# Patient Record
Sex: Male | Born: 1995
Health system: Southern US, Community
[De-identification: ages and names within clinical notes are randomized; demographics above are authoritative.]

## PROBLEM LIST (undated history)

## (undated) HISTORY — PX: OTHER SURGICAL HISTORY: SHX169

---
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
## 2015-04-29 ENCOUNTER — Emergency Department: Payer: Self-pay

## 2015-04-29 ENCOUNTER — Emergency Department
Admission: EM | Admit: 2015-04-29 | Discharge: 2015-04-29 | Disposition: A | Discharge status: Home / Self Care | Payer: Self-pay | Attending: Emergency Medicine | Admitting: Emergency Medicine

## 2015-04-29 ENCOUNTER — Encounter: Payer: Self-pay | Admitting: *Deleted

## 2015-04-29 DIAGNOSIS — Y9367 Activity, basketball: Secondary | ICD-10-CM | POA: Insufficient documentation

## 2015-04-29 DIAGNOSIS — Y998 Other external cause status: Secondary | ICD-10-CM | POA: Insufficient documentation

## 2015-04-29 DIAGNOSIS — H109 Unspecified conjunctivitis: Secondary | ICD-10-CM | POA: Insufficient documentation

## 2015-04-29 DIAGNOSIS — S40211A Abrasion of right shoulder, initial encounter: Secondary | ICD-10-CM | POA: Insufficient documentation

## 2015-04-29 DIAGNOSIS — H10022 Other mucopurulent conjunctivitis, left eye: Secondary | ICD-10-CM

## 2015-04-29 DIAGNOSIS — W1809XA Striking against other object with subsequent fall, initial encounter: Secondary | ICD-10-CM | POA: Insufficient documentation

## 2015-04-29 DIAGNOSIS — S060X1A Concussion with loss of consciousness of 30 minutes or less, initial encounter: Secondary | ICD-10-CM | POA: Insufficient documentation

## 2015-04-29 DIAGNOSIS — Y9289 Other specified places as the place of occurrence of the external cause: Secondary | ICD-10-CM | POA: Insufficient documentation

## 2015-04-29 MED ORDER — BACITRACIN ZINC 500 UNIT/GM EX OINT
TOPICAL_OINTMENT | CUTANEOUS | Status: AC
  Administered 2015-04-29: 1
  Filled 2015-04-29: qty 0.9

## 2015-04-29 MED ORDER — ERYTHROMYCIN 5 MG/GM OP OINT: TOPICAL_OINTMENT | Freq: Four times a day (QID) | OPHTHALMIC | Status: AC

## 2015-04-29 MED ORDER — ERYTHROMYCIN 5 MG/GM OP OINT
TOPICAL_OINTMENT | OPHTHALMIC | Status: AC
  Administered 2015-04-29: 1 via OPHTHALMIC
  Filled 2015-04-29: qty 1

## 2015-04-29 MED ORDER — ERYTHROMYCIN 5 MG/GM OP OINT
TOPICAL_OINTMENT | Freq: Once | OPHTHALMIC | Status: AC
  Administered 2015-04-29: 1 via OPHTHALMIC
  Filled 2015-04-29: qty 3.5

## 2015-04-29 NOTE — ED Provider Notes (Signed)
[facility] Emergency Department Provider Note   ____________________________________________  Time seen: 9:45 PM I have reviewed the triage vital signs and the triage nursing note.  HISTORY  Chief Complaint Head Injury   Historian Patient  HPI Dennis Edwards is a 19 y.o. male who is complaining of headache and nausea after playing basketball and hitting his face against some metal piece and then falling to the ground onto his right shoulder. Reports a short loss of consciousness. Since the event has had a moderate global headache, dizziness, nausea. There's been no vomiting. There's been no seizure. He is able to move his shoulder but does have an abrasion on the right posterior aspect. There is no neck pain. There is no chest pain nor trouble breathing or abdominal pain. His mother thinks that he is thinking slowly and a little bit sleepy     History reviewed. No pertinent past medical history.  There are no active problems to display for this patient.   History reviewed. No pertinent past surgical history.  Current Outpatient Rx  Name  Route  Sig  Dispense  Refill  . diphenhydrAMINE (BENADRYL) 25 MG tablet   Oral   Take 25 mg by mouth every 6 (six) hours as needed. For allergies          . erythromycin ophthalmic ointment   Both Eyes   Place into both eyes 4 (four) times daily.   3.5 g   0   . ibuprofen (ADVIL,MOTRIN) 200 MG tablet   Oral   Take 400 mg by mouth every 6 (six) hours as needed. For pain            Allergies Review of patient's allergies indicates no known allergies.  No family history on file.  Social History History  Substance Use Topics  . Smoking status: Never Smoker   . Smokeless tobacco: Not on file  . Alcohol Use: No    Review of Systems  Constitutional: Negative for recent illness Eyes: Negative for visual changes. Left eye pain and itching and redness for 1 day, prior to the head trauma ENT: No  facial lacerations Cardiovascular: Negative for chest pain. Respiratory: Negative for shortness of breath. Gastrointestinal: Negative for abdominal pain Genitourinary:  Musculoskeletal: Negative for back pain. Skin: Negative for rash. Neurological: Negative for focal weakness or numbness  ____________________________________________   PHYSICAL EXAM:  VITAL SIGNS: ED Triage Vitals  Enc Vitals Group     BP 04/29/15 2110 109/65 mmHg     Pulse Rate 04/29/15 2110 96     Resp 04/29/15 2110 18     Temp 04/29/15 2110 98.9 F (37.2 C)     Temp Source 04/29/15 2110 Oral     SpO2 04/29/15 2110 97 %     Weight 04/29/15 2110 145 lb (65.772 kg)     Height 04/29/15 2110 6' (1.829 m)     Head Cir --      Peak Flow --      Pain Score 04/29/15 2111 5     Pain Loc --      Pain Edu? --      Excl. in GC? --      Constitutional: Alert and oriented. Well appearing and in no distress. Eyes: Left eye conjunctiva is injected. There is no hyphema bilaterally. PERRL. Normal extraocular movements. ENT   Head: Normocephalic and atraumatic.   Nose: No congestion/rhinnorhea.   Mouth/Throat: Mucous membranes are moist.   Neck: No stridor. Cardiovascular: Normal rate,  regular rhythm.  No murmurs, rubs, or gallops. Respiratory: Normal respiratory effort without tachypnea nor retractions. Breath sounds are clear and equal bilaterally. No wheezes/rales/rhonchi. Gastrointestinal: Soft and nontender. No distention.  Genitourinary: Deferred Musculoskeletal: Nontender with normal range of motion in all extremities. No joint effusions.  No lower extremity tenderness nor edema. Skin abrasion over posterior right shoulder with full range of motion of the right shoulder. Neurologic:  Normal speech and language. Slightly slow to answer questions. No gross focal neurologic deficits are appreciated. Skin:  Skin is warm, dry and intact. Skin abrasion on posterior right shoulder Psychiatric: Mood and  affect are normal. Speech and behavior are normal. Patient exhibits appropriate insight and judgment.  ____________________________________________   EKG   ____________________________________________  LABS (pertinent positives/negatives)  None  ____________________________________________  RADIOLOGY Radiologist results reviewed  CT head: Negative for acute intracranial injury __________________________________________  PROCEDURES  Procedure(s) performed: None Critical Care performed: None  ____________________________________________   ED COURSE / ASSESSMENT AND PLAN  Pertinent labs & imaging results that were available during my care of the patient were reviewed by me and considered in my medical decision making (see chart for details).  I discussed with mom the patient does have symptoms consistent with a concussion, however given that they still feel like he is not at his baseline, and with persistent headache and nausea, we discussed head CT for intracranial evaluation. Risks versus benefits were discussed and we chose to proceed.   Negative for intracranial chronic injury. Patient were given erythromycin eye ointment for left sided pinkeye. Concussion precautions discussed with mom and patient. [facility].   ___________________________________________   FINAL CLINICAL IMPRESSION(S) / ED DIAGNOSES   Final diagnoses:  Abrasion of right shoulder area, initial encounter  Head injury, closed, with concussion, with loss of consciousness of 30 minutes or less, initial encounter  Pink eye disease of left eye      Governor Rooksebecca Shai Rasmussen, MD 04/29/15 2222

## 2015-04-29 NOTE — ED Notes (Signed)
Pt reports he was playing basketball and went for a pass, he hit his face/head on a metal piece and fell to the ground. States LOC, with nausea and headache since the event. Abrasion to right shoulder.

## 2015-04-29 NOTE — ED Notes (Signed)
Patient with no complaints at this time. Respirations even and unlabored. Skin warm/dry. Discharge instructions reviewed with patient at this time. Patient given opportunity to voice concerns/ask questions. Patient discharged at this time and left Emergency Department with steady gait.   

## 2015-04-29 NOTE — Discharge Instructions (Signed)
Your symptoms are consistent with a concussion. No sports until released by a physician. You are referred to primary care at [facility]. Return to the emergency department for any new or worsening condition including severe headache, altered mental status, weakness, numbness, seizure, any new or worsening pain.  Abrasion An abrasion is a cut or scrape of the skin. Abrasions do not extend through all layers of the skin and most heal within 10 days. It is important to care for your abrasion properly to prevent infection. CAUSES  Most abrasions are caused by falling on, or gliding across, the ground or other surface. When your skin rubs on something, the outer and inner layer of skin rubs off, causing an abrasion. DIAGNOSIS  Your caregiver will be able to diagnose an abrasion during a physical exam.  TREATMENT  Your treatment depends on how large and deep the abrasion is. Generally, your abrasion will be cleaned with water and a mild soap to remove any dirt or debris. An antibiotic ointment may be put over the abrasion to prevent an infection. A bandage (dressing) may be wrapped around the abrasion to keep it from getting dirty.  You may need a tetanus shot if:  You cannot remember when you had your last tetanus shot.  You have never had a tetanus shot.  The injury broke your skin. If you get a tetanus shot, your arm may swell, get red, and feel warm to the touch. This is common and not a problem. If you need a tetanus shot and you choose not to have one, there is a rare Samik of getting tetanus. Sickness from tetanus can be serious.  HOME CARE INSTRUCTIONS   If a dressing was applied, change it at least once a day or as directed by your caregiver. If the bandage sticks, soak it off with warm water.   Wash the area with water and a mild soap to remove all the ointment 2 times a day. Rinse off the soap and pat the area dry with a clean towel.   Reapply any ointment as directed by your  caregiver. This will help prevent infection and keep the bandage from sticking. Use gauze over the wound and under the dressing to help keep the bandage from sticking.   Change your dressing right away if it becomes wet or dirty.   Only take over-the-counter or prescription medicines for pain, discomfort, or fever as directed by your caregiver.   Follow up with your caregiver within 24-48 hours for a wound check, or as directed. If you were not given a wound-check appointment, look closely at your abrasion for redness, swelling, or pus. These are signs of infection. SEEK IMMEDIATE MEDICAL CARE IF:   You have increasing pain in the wound.   You have redness, swelling, or tenderness around the wound.   You have pus coming from the wound.   You have a fever or persistent symptoms for more than 2-3 days.  You have a fever and your symptoms suddenly get worse.  You have a bad smell coming from the wound or dressing.  MAKE SURE YOU:   Understand these instructions.  Will watch your condition.  Will get help right away if you are not doing well or get worse. Document Released: 08/26/2005 Document Revised: 11/02/2012 Document Reviewed: 10/20/2011 [facility] Patient Information 2015 [place], [place]. This information is not intended to replace advice given to you by your health care provider. Make sure you discuss any questions you have with your  health care provider.  Head Injury The head may be injured, even if there are no obvious signs of injury. Obvious signs of injury include, loss of consciousness (being "knocked out"), or physical signs, such as an open wound (the skin is broken) or bruising (ecchymosis). SYMPTOMS   Symptoms depend on the extent of injury.  The seriousness of a head injury is not related to the presence of physical signs, such as swelling.  Headache.  Nausea and vomiting.  Drowsiness.  Memory loss (amnesia).  Vision problems.  Confusion or  irritability.  Pupils of different size.  Pupils that do not react to light.  Loss of consciousness, either temporary or for long periods.  Bleeding of the scalp, if the skin is broken. CAUSES  The most common cause of head injury is direct hit (trauma) to the head. Common causes of this injury include: motor vehicle crashes, falls, or tackling with the head (football). RISK INCREASES WITH:   Contact sports (i.e. football, boxing), riding bicycles, motorcycles, or horses without a helmet.  Seizure disorders.  Drinking alcohol.  Use of mind-altering drugs. PREVENTION   Wear properly fitted and padded protective headgear.  Do not drink alcohol or use mind-altering drugs and drive. PROGNOSIS  If treated early, most head injuries can be cured. However, certain problems involved with head injuries can be life threatening. RELATED COMPLICATIONS   Subdural hemorrhage or epidural hematoma (bleeding under the skull).  Concussion (injury to the brain).  Bleeding into the brain. TREATMENT  Any injury to the head should be evaluated by a medical professional, especially if the injury involves loss of consciousness or other symptoms noted above. Severe head injuries may require hospitalization and possible surgery, to relieve pressure on the brain. After evaluation, if you are sent to be watched at home, it is important to have someone else wake you up every 2 hours, for at least 24 hours following the injury. If the person is not able to wake you, or if there appears to be a change in your responsiveness, he or she must contact your caregiver immediately. This may be a sign of a more serious injury. Also, report any of the following symptoms: nausea, vomiting, inability to move arms and legs equally well on both sides, fever (above 100 F or 37.8 C), neck stiffness, pupils of unequal size or shape or reaction to light, convulsions, noticeable restlessness, severe headache that persists for  longer than 4 hours after injury, confusion, disorientation, or mental status changes.  MEDICATION  Do not take any medicines, unless advised by your caregiver. This includes over-the-counter pain medicines (i.e. ibuprofen, acetaminophen, aspirin). SEEK MEDICAL CARE IF:   Symptoms get worse or do not improve in 24 hours.  Any of the following symptoms occur:  Vomiting.  Inability to move arms and legs equally well on both sides.  Fever above 100 F (37.8 C).  Stiff neck.  Pupils of unequal size, shape, or reaction to light.  Convulsions (violent shaking).  Noticeable restlessness.  Severe headache that persists for longer than 4 hours after injury.  Confusion, disorientation or mental status changes. Document Released: 11/16/2005 Document Revised: 02/08/2012 Document Reviewed: 02/28/2009 [facility] Patient Information 2015 [place], [place]. This information is not intended to replace advice given to you by your health care provider. Make sure you discuss any questions you have with your health care provider.

## 2015-06-19 ENCOUNTER — Emergency Department
Admission: EM | Admit: 2015-06-19 | Discharge: 2015-06-19 | Disposition: A | Discharge status: Home / Self Care | Payer: Self-pay | Attending: Emergency Medicine | Admitting: Emergency Medicine

## 2015-06-19 ENCOUNTER — Encounter: Payer: Self-pay | Admitting: Emergency Medicine

## 2015-06-19 DIAGNOSIS — N390 Urinary tract infection, site not specified: Secondary | ICD-10-CM | POA: Insufficient documentation

## 2015-06-19 DIAGNOSIS — Z720 Tobacco use: Secondary | ICD-10-CM | POA: Insufficient documentation

## 2015-06-19 DIAGNOSIS — A64 Unspecified sexually transmitted disease: Secondary | ICD-10-CM | POA: Insufficient documentation

## 2015-06-19 LAB — CHLAMYDIA/NGC RT PCR (ARMC ONLY)
CHLAMYDIA TR: NOT DETECTED
N gonorrhoeae: NOT DETECTED

## 2015-06-19 LAB — URINALYSIS COMPLETE WITH MICROSCOPIC (ARMC ONLY)
Bacteria, UA: NONE SEEN
Bilirubin Urine: NEGATIVE
Glucose, UA: NEGATIVE mg/dL
Hgb urine dipstick: NEGATIVE
Ketones, ur: NEGATIVE mg/dL
Nitrite: NEGATIVE
Protein, ur: 30 mg/dL — AB
RBC / HPF: NONE SEEN RBC/hpf (ref 0–5)
Specific Gravity, Urine: 1.026 (ref 1.005–1.030)
Squamous Epithelial / LPF: NONE SEEN
pH: 8 (ref 5.0–8.0)

## 2015-06-19 MED ORDER — AZITHROMYCIN 250 MG PO TABS
1000.0000 mg | ORAL_TABLET | Freq: Once | ORAL | Status: AC
Start: 2015-06-19 — End: 2015-06-19
  Administered 2015-06-19: 1000 mg via ORAL
  Filled 2015-06-19: qty 4

## 2015-06-19 MED ORDER — CEFTRIAXONE SODIUM 250 MG IJ SOLR
250.0000 mg | Freq: Once | INTRAMUSCULAR | Status: AC
  Administered 2015-06-19: 250 mg via INTRAMUSCULAR
  Filled 2015-06-19: qty 250

## 2015-06-19 MED ORDER — SULFAMETHOXAZOLE-TRIMETHOPRIM 400-80 MG PO TABS: 1.0000 | ORAL_TABLET | Freq: Two times a day (BID) | ORAL | Status: DC

## 2015-06-19 NOTE — ED Notes (Signed)
Pt noticed today that he had some greenish penile drainage and burning when urinating.

## 2015-06-19 NOTE — Discharge Instructions (Signed)
As discussed, all partners need to be tested and treated. You need to update your partners. Practice safe sex. Take medication as prescribed.   [facility]. See handout.  Return to the ER for new or worsening concerns. Sexually Transmitted Disease A sexually transmitted disease (STD) is a disease or infection that may be passed (transmitted) from person to person, usually during sexual activity. This may happen by way of saliva, semen, blood, vaginal mucus, or urine. Common STDs include:   Gonorrhea.   Chlamydia.   Syphilis.   HIV and AIDS.   Genital herpes.   Hepatitis B and C.   Trichomonas.   Human papillomavirus (HPV).   Pubic lice.   Scabies.  Mites.  Bacterial vaginosis. WHAT ARE CAUSES OF STDs? An STD may be caused by bacteria, a virus, or parasites. STDs are often transmitted during sexual activity if one person is infected. However, they may also be transmitted through nonsexual means. STDs may be transmitted after:   Sexual intercourse with an infected person.   Sharing sex toys with an infected person.   Sharing needles with an infected person or using unclean piercing or tattoo needles.  Having intimate contact with the genitals, mouth, or rectal areas of an infected person.   Exposure to infected fluids during birth. WHAT ARE THE SIGNS AND SYMPTOMS OF STDs? Different STDs have different symptoms. Some people may not have any symptoms. If symptoms are present, they may include:   Painful or bloody urination.   Pain in the pelvis, abdomen, vagina, anus, throat, or eyes.   A skin rash, itching, or irritation.  Growths, ulcerations, blisters, or sores in the genital and anal areas.  Abnormal vaginal discharge with or without bad odor.   Penile discharge in men.   Fever.   Pain or bleeding during sexual intercourse.   Swollen glands in the groin area.   Yellow skin and eyes  (jaundice). This is seen with hepatitis.   Swollen testicles.  Infertility.  Sores and blisters in the mouth. HOW ARE STDs DIAGNOSED? To make a diagnosis, your health care provider may:   Take a medical history.   Perform a physical exam.   Take a sample of any discharge to examine.  Swab the throat, cervix, opening to the penis, rectum, or vagina for testing.  Test a sample of your first morning urine.   Perform blood tests.   Perform a Pap test, if this applies.   Perform a colposcopy.   Perform a laparoscopy.  HOW ARE STDs TREATED? Treatment depends on the STD. Some STDs may be treated but not cured.   Chlamydia, gonorrhea, trichomonas, and syphilis can be cured with antibiotic medicine.   Genital herpes, hepatitis, and HIV can be treated, but not cured, with prescribed medicines. The medicines lessen symptoms.   Genital warts from HPV can be treated with medicine or by freezing, burning (electrocautery), or surgery. Warts may come back.   HPV cannot be cured with medicine or surgery. However, abnormal areas may be removed from the cervix, vagina, or vulva.   If your diagnosis is confirmed, your recent sexual partners need treatment. This is true even if they are symptom-free or have a negative culture or evaluation. They should not have sex until their health care providers say it is okay. HOW CAN I REDUCE MY RISK OF GETTING AN STD? Take these steps to reduce your risk of getting an STD:  Use latex condoms, dental dams,  and water-soluble lubricants during sexual activity. Do not use petroleum jelly or oils.  Avoid having multiple sex partners.  Do not have sex with someone who has other sex partners.  Do not have sex with anyone you do not know or who is at high risk for an STD.  Avoid risky sex practices that can break your skin.  Do not have sex if you have open sores on your mouth or skin.  Avoid drinking too much alcohol or taking illegal  drugs. Alcohol and drugs can affect your judgment and put you in a vulnerable position.  Avoid engaging in oral and anal sex acts.  Get vaccinated for HPV and hepatitis. If you have not received these vaccines in the past, talk to your health care provider about whether one or both might be right for you.   If you are at risk of being infected with HIV, it is recommended that you take a prescription medicine daily to prevent HIV infection. This is called pre-exposure prophylaxis (PrEP). You are considered at risk if:  You are a man who has sex with other men (MSM).  You are a heterosexual man or woman and are sexually active with more than one partner.  You take drugs by injection.  You are sexually active with a partner who has HIV.  Talk with your health care provider about whether you are at high risk of being infected with HIV. If you choose to begin PrEP, you should first be tested for HIV. You should then be tested every 3 months for as long as you are taking PrEP.  WHAT SHOULD I DO IF I THINK I HAVE AN STD?  See your health care provider.   Tell your sexual partner(s). They should be tested and treated for any STDs.  Do not have sex until your health care provider says it is okay. WHEN SHOULD I GET IMMEDIATE MEDICAL CARE? Contact your health care provider right away if:   You have severe abdominal pain.  You are a man and notice swelling or pain in your testicles.  You are a woman and notice swelling or pain in your vagina. Document Released: 02/06/2003 Document Revised: 11/21/2013 Document Reviewed: 06/06/2013 [facility] Patient Information 2015 [place], [place]. This information is not intended to replace advice given to you by your health care provider. Make sure you discuss any questions you have with your health care provider.  Urinary Tract Infection A urinary tract infection (UTI) can occur any place along the urinary tract. The tract includes the kidneys, ureters,  bladder, and urethra. A type of germ called bacteria often causes a UTI. UTIs are often helped with antibiotic medicine.  HOME CARE   If given, take antibiotics as told by your doctor. Finish them even if you start to feel better.  Drink enough fluids to keep your pee (urine) clear or pale yellow.  Avoid tea, drinks with caffeine, and bubbly (carbonated) drinks.  Pee often. Avoid holding your pee in for a long time.  Pee before and after having sex (intercourse).  Wipe from front to back after you poop (bowel movement) if you are a woman. Use each tissue only once. GET HELP RIGHT AWAY IF:   You have back pain.  You have lower belly (abdominal) pain.  You have chills.  You feel sick to your stomach (nauseous).  You throw up (vomit).  Your burning or discomfort with peeing does not go away.  You have a fever.  Your symptoms are  not better in 3 days. MAKE SURE YOU:   Understand these instructions.  Will watch your condition.  Will get help right away if you are not doing well or get worse. Document Released: 05/04/2008 Document Revised: 08/10/2012 Document Reviewed: 06/16/2012 [facility] Patient Information 2015 [place], [place]. This information is not intended to replace advice given to you by your health care provider. Make sure you discuss any questions you have with your health care provider.

## 2015-06-19 NOTE — ED Provider Notes (Signed)
[facility] Emergency Department Provider Note  ____________________________________________  Time seen: Approximately 7:02 PM  I have reviewed the triage vital signs and the nursing notes.   HISTORY  Chief Complaint Penile Discharge   HPI Dennis Edwards is a 19 y.o. male presents to ER with mother at bedside. Patient says that he presents to the ER as he noticed today with some greenish penile discharge. Patient states that he has been sexually active for 2 years and has had a total of 5 partners with 1 recent new partner in last week. Denies rectal intercourse.  Patient reports times one day with thick greenish discharge as well as feeling mild discomfort with urination. Denies burning with urination, urinary frequency, urinary urgency, retention or incontinence. Denies bowel changes. Denies abdominal pain. Denies back pain. Denies fever.  Denies pain at this time.   History reviewed. No pertinent past medical history.  There are no active problems to display for this patient.   History reviewed. No pertinent past surgical history.  Current Outpatient Rx  Name  Route  Sig  Dispense  Refill  .           Marland Kitchen             Allergies Review of patient's allergies indicates no known allergies.  No family history on file.  Social History History  Substance Use Topics  . Smoking status: Current Some Day Smoker  . Smokeless tobacco: Not on file  . Alcohol Use: No    Review of Systems Constitutional: No fever/chills Eyes: No visual changes. ENT: No sore throat. Cardiovascular: Denies chest pain. Respiratory: Denies shortness of breath. Gastrointestinal: No abdominal pain.  No nausea, no vomiting.  No diarrhea.  No constipation. Genitourinary: Negative for dysuria. Penile discharge. Musculoskeletal: Negative for back pain. Skin: Negative for rash. Neurological: Negative for headaches, focal weakness or numbness. 10-point ROS otherwise  negative.  ____________________________________________   PHYSICAL EXAM:  VITAL SIGNS: ED Triage Vitals  Enc Vitals Group     BP 06/19/15 1848 117/66 mmHg     Pulse Rate 06/19/15 1848 73     Resp 06/19/15 1848 20     Temp 06/19/15 1848 97.9 F (36.6 C)     Temp Source 06/19/15 1848 Oral     SpO2 06/19/15 1848 100 %     Weight 06/19/15 1848 135 lb (61.236 kg)     Height 06/19/15 1848  (1.854 m)     Head Cir --      Peak Flow --      Pain Score 06/19/15 1849 3     Pain Loc --      Pain Edu? --      Excl. in GC? --    Exam completed with Crystal ED tech at bedside  Constitutional: Alert and oriented. Well appearing and in no acute distress. Eyes: Conjunctivae are normal. PERRL. EOMI. Head: Atraumatic. Nose: No congestion/rhinnorhea. Mouth/Throat: Mucous membranes are moist.  Oropharynx non-erythematous. Neck: No stridor.  No cervical spine tenderness to palpation. Hematological/Lymphatic/Immunilogical: No cervical lymphadenopathy. Cardiovascular: Normal rate, regular rhythm. Grossly normal heart sounds.  Good peripheral circulation. Respiratory: Normal respiratory effort.  No retractions. Lungs CTAB. Gastrointestinal: Soft and nontender. No distention. No abdominal bruits. No CVA tenderness.normal bowel sounds.  Genitourinary: minimal active greenish penile discharge. No penile or testicular pain. No lesions or rash.  Musculoskeletal: No lower or upper extremity tenderness nor edema.  No joint effusions. Neurologic:  Normal speech and language. No gross focal  neurologic deficits are appreciated. No gait instability. Skin:  Skin is warm, dry and intact. No rash noted. Psychiatric: Mood and affect are normal. Speech and behavior are normal.  ____________________________________________   LABS (all labs ordered are listed, but only abnormal results are displayed)  Labs Reviewed  URINALYSIS COMPLETEWITH MICROSCOPIC ([facility] ONLY) - Abnormal; Notable for the following:     Color, Urine YELLOW (*)    APPearance HAZY (*)    Protein, ur 30 (*)    Leukocytes, UA 2+ (*)    All other components within normal limits  CHLAMYDIA/NGC RT PCR ([facility] ONLY)  URINE CULTURE   ________________________________________   INITIAL IMPRESSION / ASSESSMENT AND PLAN / ED COURSE  Pertinent labs & imaging results that were available during my care of the patient were reviewed by me and considered in my medical decision making (see chart for details).  Very well-appearing patient. No acute distress. Presents the ER for the complaints of greenish penile discharge times one day. Reports recent new sexual partner. Denies abdominal pain, back pain, fever or other complaints. Will treat patient in ER for gonorrhea/chlamydia and send urine for testing. With 1 g oral azithromycin and 250 mg IM Rocephin.  Patient urinalysis hazy with 2+ leukocytes, too numerous to count white blood cells and protein present. Suspect urinalysis finding secondary to STD. However will treat patient with three-day Bactrim to cover for UTI and will culture urine. Discussed this with patient and mother. Patient verbalized understanding and agreed to plan. Patient to follow up [facility] in one week for follow-up. Discussed with patient all partners need to be evaluated and treated.discussed return parameters. Patient verbalized understanding and agreed to plan.    ____________________________________________   FINAL CLINICAL IMPRESSION(S) / ED DIAGNOSES  Final diagnoses:  STD (male)  UTI (lower urinary tract infection)     Renford DillsLindsey Chasya Zenz, NP 06/19/15 2028  Myrna Blazeravid Matthew Schaevitz, MD 06/19/15 [phone]

## 2015-06-21 LAB — URINE CULTURE
Culture: NO GROWTH
SPECIAL REQUESTS: NORMAL

## 2015-08-03 ENCOUNTER — Emergency Department
Admission: EM | Admit: 2015-08-03 | Discharge: 2015-08-03 | Disposition: A | Discharge status: Home / Self Care | Payer: Self-pay | Attending: Emergency Medicine | Admitting: Emergency Medicine

## 2015-08-03 ENCOUNTER — Encounter: Payer: Self-pay | Admitting: Emergency Medicine

## 2015-08-03 DIAGNOSIS — Z720 Tobacco use: Secondary | ICD-10-CM | POA: Insufficient documentation

## 2015-08-03 DIAGNOSIS — N342 Other urethritis: Secondary | ICD-10-CM | POA: Insufficient documentation

## 2015-08-03 LAB — URINALYSIS COMPLETE WITH MICROSCOPIC (ARMC ONLY)
BACTERIA UA: NONE SEEN
Bilirubin Urine: NEGATIVE
Glucose, UA: NEGATIVE mg/dL
Hgb urine dipstick: NEGATIVE
KETONES UR: NEGATIVE mg/dL
NITRITE: NEGATIVE
PH: 7 (ref 5.0–8.0)
PROTEIN: NEGATIVE mg/dL
SPECIFIC GRAVITY, URINE: 1.023 (ref 1.005–1.030)
Squamous Epithelial / LPF: NONE SEEN

## 2015-08-03 LAB — CHLAMYDIA/NGC RT PCR (ARMC ONLY)
Chlamydia Tr: NOT DETECTED
N gonorrhoeae: NOT DETECTED

## 2015-08-03 MED ORDER — CEFTRIAXONE SODIUM 250 MG IJ SOLR
250.0000 mg | Freq: Once | INTRAMUSCULAR | Status: AC
Start: 2015-08-03 — End: 2015-08-03
  Administered 2015-08-03: 250 mg via INTRAMUSCULAR
  Filled 2015-08-03: qty 250

## 2015-08-03 MED ORDER — AZITHROMYCIN 250 MG PO TABS
1000.0000 mg | ORAL_TABLET | Freq: Once | ORAL | Status: AC
  Administered 2015-08-03: 1000 mg via ORAL
  Filled 2015-08-03: qty 4

## 2015-08-03 NOTE — ED Provider Notes (Signed)
[facility] Emergency Department Provider Note ____________________________________________  Time seen: 77  I have reviewed the triage vital signs and the nursing notes.  HISTORY  Chief Complaint  SEXUALLY TRANSMITTED DISEASE  HPI Dennis Edwards is a 19 y.o. male reports the onset of a penile discharge this morning upon awakening. He also notes some mild discomfort with urination. He denies any unprotected sex, and notes his most recentencounter was 2 days prior. Before that he had intercourse 2 weeks prior with the same partner. He does admit to unprotected fellatio. Denies any fevers, chills, sweats, nausea, vomiting, or pelvic pain.  History reviewed. No pertinent past medical history.  There are no active problems to display for this patient.  History reviewed. No pertinent past surgical history.  Current Outpatient Rx  Name  Route  Sig  Dispense  Refill  . diphenhydrAMINE (BENADRYL) 25 MG tablet   Oral   Take 25 mg by mouth every 6 (six) hours as needed. For allergies          . ibuprofen (ADVIL,MOTRIN) 200 MG tablet   Oral   Take 400 mg by mouth every 6 (six) hours as needed. For pain          . sulfamethoxazole-trimethoprim (BACTRIM) 400-80 MG per tablet   Oral   Take 1 tablet by mouth 2 (two) times daily. For 3 days   6 tablet   0    Allergies Review of patient's allergies indicates no known allergies.  No family history on file.  Social History Social History  Substance Use Topics  . Smoking status: Current Every Day Smoker  . Smokeless tobacco: None  . Alcohol Use: No   Review of Systems  Constitutional: Negative for fever. Eyes: Negative for visual changes. ENT: Negative for sore throat. Cardiovascular: Negative for chest pain. Respiratory: Negative for shortness of breath. Gastrointestinal: Negative for abdominal pain, vomiting and diarrhea. Genitourinary: Negative for dysuria. Reports penile discharge as  above Musculoskeletal: Negative for back pain. Skin: Negative for rash. Neurological: Negative for headaches, focal weakness or numbness. ____________________________________________  PHYSICAL EXAM:  VITAL SIGNS: ED Triage Vitals  Enc Vitals Group     BP 08/03/15 1811 120/54 mmHg     Pulse Rate 08/03/15 1811 72     Resp 08/03/15 1811 16     Temp 08/03/15 1809 97.8 F (36.6 C)     Temp Source 08/03/15 1809 Oral     SpO2 08/03/15 1811 96 %     Weight 08/03/15 1809 130 lb (58.968 kg)     Height 08/03/15 1809  (1.854 m)     Head Cir --      Peak Flow --      Pain Score 08/03/15 1810 8     Pain Loc --      Pain Edu? --      Excl. in GC? --    Constitutional: Alert and oriented. Well appearing and in no distress. Eyes: Conjunctivae are normal. PERRL. Normal extraocular movements. ENT   Head: Normocephalic and atraumatic.   Nose: No congestion/rhinnorhea.   Mouth/Throat: Mucous membranes are moist.   Neck: Supple. No thyromegaly. Hematological/Lymphatic/Immunilogical: No cervical lymphadenopathy. Cardiovascular: Normal rate, regular rhythm.  Respiratory: Normal respiratory effort. No wheezes/rales/rhonchi. Gastrointestinal: Soft and nontender. No distention. Musculoskeletal: Nontender with normal range of motion in all extremities.  Neurologic:  Normal gait without ataxia. Normal speech and language. No gross focal neurologic deficits are appreciated. Skin:  Skin is warm, dry and intact. No  rash noted. Psychiatric: Mood and affect are normal. Patient exhibits appropriate insight and judgment. ____________________________________________    LABS (pertinent positives/negatives) Labs Reviewed  CHLAMYDIA/NGC RT PCR ([facility] ONLY)  URINALYSIS COMPLETEWITH MICROSCOPIC ([facility] ONLY)  ____________________________________________  PROCEDURES  Azithromycin 1000 mg PO Rocephin 250 mg IM ____________________________________________  INITIAL IMPRESSION /  ASSESSMENT AND PLAN / ED COURSE  Empiric treatment for presumed acute urethritis due to gonorrhea chlamydia. Patient will call tomorrow for lab results. He is encouraged to abstain from sexual intercourse until at least 1 week and symptoms have resolved. He is follow with [facility] for further testing for retesting as needed. He is also encouraged to by his sexual partners so that they may seek treatment. ____________________________________________  FINAL CLINICAL IMPRESSION(S) / ED DIAGNOSES  Final diagnoses:  Urethritis     Lissa Hoard, PA-C 08/03/15 1929  Emily Filbert, MD 08/03/15 2115

## 2015-08-03 NOTE — Discharge Instructions (Signed)
Urethritis Urethritis is an inflammation of the tube through which urine exits your bladder (urethra).  CAUSES Urethritis is often caused by an infection in your urethra. The infection can be viral, like herpes. The infection can also be bacterial, like gonorrhea. RISK FACTORS Risk factors of urethritis include:  Having sex without using a condom.  Having multiple sexual partners.  Having poor hygiene. SIGNS AND SYMPTOMS Symptoms of urethritis are less noticeable in women than in men. These symptoms include:  Burning feeling when you urinate (dysuria).  Discharge from your urethra.  Blood in your urine (hematuria).  Urinating more than usual. DIAGNOSIS  To confirm a diagnosis of urethritis, your health care provider will do the following:  Ask about your sexual history.  Perform a physical exam.  Have you provide a sample of your urine for lab testing.  Use a cotton swab to gently collect a sample from your urethra for lab testing. TREATMENT  It is important to treat urethritis. Depending on the cause, untreated urethritis may lead to serious genital infections and possibly infertility. Urethritis caused by a bacterial infection is treated with antibiotic medicine. All sexual partners must be treated.  HOME CARE INSTRUCTIONS  Do not have sex until the test results are known and treatment is completed, even if your symptoms go away before you finish treatment.  If you were prescribed an antibiotic, finish it all even if you start to feel better. SEEK MEDICAL CARE IF:   Your symptoms are not improved in 3 days.  Your symptoms are getting worse.  You develop abdominal pain or pelvic pain (in women).  You develop joint pain.  You have a fever. SEEK IMMEDIATE MEDICAL CARE IF:   You have severe pain in the belly, back, or side.  You have repeated vomiting. MAKE SURE YOU:  Understand these instructions.  Will watch your condition.  Will get help right away if you  are not doing well or get worse. Document Released: 05/12/2001 Document Revised: 04/02/2014 Document Reviewed: 07/17/2013 [facility] Patient Information 2015 [place], [place]. This information is not intended to replace advice given to you by your health care provider. Make sure you discuss any questions you have with your health care provider.  Follow-up with Dr. Sherrie Mustache as needed.

## 2015-08-03 NOTE — ED Notes (Signed)
Pt reports unprotected sex 2 days ago, reports now with green discharge from head of penis. Pt reports some burning with urination, reports sensation of a full bladder after emptying.

## 2015-08-03 NOTE — ED Notes (Addendum)
Pt placed on med hold until 1950. Pt made aware and verbalized understanding at this time.

## 2016-08-18 ENCOUNTER — Emergency Department
Admission: EM | Admit: 2016-08-18 | Discharge: 2016-08-18 | Disposition: A | Discharge status: Home / Self Care | Payer: Self-pay | Attending: Emergency Medicine | Admitting: Emergency Medicine

## 2016-08-18 ENCOUNTER — Encounter: Payer: Self-pay | Admitting: Emergency Medicine

## 2016-08-18 DIAGNOSIS — Z791 Long term (current) use of non-steroidal anti-inflammatories (NSAID): Secondary | ICD-10-CM | POA: Insufficient documentation

## 2016-08-18 DIAGNOSIS — R300 Dysuria: Secondary | ICD-10-CM | POA: Insufficient documentation

## 2016-08-18 DIAGNOSIS — Z79899 Other long term (current) drug therapy: Secondary | ICD-10-CM | POA: Insufficient documentation

## 2016-08-18 DIAGNOSIS — F17200 Nicotine dependence, unspecified, uncomplicated: Secondary | ICD-10-CM | POA: Insufficient documentation

## 2016-08-18 LAB — URINALYSIS COMPLETE WITH MICROSCOPIC (ARMC ONLY)
BACTERIA UA: NONE SEEN
BILIRUBIN URINE: NEGATIVE
GLUCOSE, UA: NEGATIVE mg/dL
Hgb urine dipstick: NEGATIVE
Ketones, ur: NEGATIVE mg/dL
Nitrite: NEGATIVE
Protein, ur: NEGATIVE mg/dL
SQUAMOUS EPITHELIAL / LPF: NONE SEEN
Specific Gravity, Urine: 1.018 (ref 1.005–1.030)
pH: 7 (ref 5.0–8.0)

## 2016-08-18 LAB — CHLAMYDIA/NGC RT PCR (ARMC ONLY)
CHLAMYDIA TR: NOT DETECTED
N GONORRHOEAE: NOT DETECTED

## 2016-08-18 MED ORDER — PHENAZOPYRIDINE HCL 200 MG PO TABS: 200.0000 mg | ORAL_TABLET | Freq: Three times a day (TID) | ORAL | 0 refills | Status: DC | PRN

## 2016-08-18 NOTE — Discharge Instructions (Signed)
Advised patient will be contacted telephonically and chlamydia /gonorrhea results are positive

## 2016-08-18 NOTE — ED Provider Notes (Signed)
[facility] Emergency Department Provider Note   ____________________________________________   None    (approximate)  I have reviewed the triage vital signs and the nursing notes.   HISTORY  Chief Complaint Dysuria    HPI Dennis Edwards is a 20 y.o. male patient complain of dysuria and greenish urethral discharge upon a.m. awakening. Patient states last sexual contact was greater then 5 months ago. Patient states last STD was positive gonorrhea greater than a year ago. Patient denies any pain at this time. No palliative measures for this complaint.   History reviewed. No pertinent past medical history.  There are no active problems to display for this patient.   History reviewed. No pertinent surgical history.  Prior to Admission medications   Medication Sig Start Date End Date Taking? Authorizing Provider  diphenhydrAMINE (BENADRYL) 25 MG tablet Take 25 mg by mouth every 6 (six) hours as needed. For allergies     Historical Provider, MD  ibuprofen (ADVIL,MOTRIN) 200 MG tablet Take 400 mg by mouth every 6 (six) hours as needed. For pain     Historical Provider, MD  sulfamethoxazole-trimethoprim (BACTRIM) 400-80 MG per tablet Take 1 tablet by mouth 2 (two) times daily. For 3 days 06/19/15   Renford DillsLindsey Miller, NP    Allergies Review of patient's allergies indicates no known allergies.  No family history on file.  Social History Social History  Substance Use Topics  . Smoking status: Current Every Day Smoker  . Smokeless tobacco: Never Used  . Alcohol use No    Review of Systems Constitutional: No fever/chills Eyes: No visual changes. ENT: No sore throat. Cardiovascular: Denies chest pain. Respiratory: Denies shortness of breath. Gastrointestinal: No abdominal pain.  No nausea, no vomiting.  No diarrhea.  No constipation. Genitourinary: Positive for dysuria. Urethral discharge Musculoskeletal: Negative for back pain. Skin: Negative for  rash. Neurological: Negative for headaches, focal weakness or numbness.    ____________________________________________   PHYSICAL EXAM:  VITAL SIGNS: ED Triage Vitals [08/18/16 1441]  Enc Vitals Group     BP 120/66     Pulse Rate 63     Resp 16     Temp 98.3 F (36.8 C)     Temp Source Oral     SpO2 100 %     Weight 135 lb (61.2 kg)     Height 6\' 1"  (1.854 m)     Head Circumference      Peak Flow      Pain Score 0     Pain Loc      Pain Edu?      Excl. in GC?     Constitutional: Alert and oriented. Well appearing and in no acute distress. Eyes: Conjunctivae are normal. PERRL. EOMI. Head: Atraumatic. Nose: No congestion/rhinnorhea. Mouth/Throat: Mucous membranes are moist.  Oropharynx non-erythematous. Neck: No stridor.  No cervical spine tenderness to palpation. Hematological/Lymphatic/Immunilogical: No cervical lymphadenopathy. Cardiovascular: Normal rate, regular rhythm. Grossly normal heart sounds.  Good peripheral circulation. Respiratory: Normal respiratory effort.  No retractions. Lungs CTAB. Gastrointestinal: Soft and nontender. No distention. No abdominal bruits. No CVA tenderness. Genitourinary: No l lesions or obvious discharge. Musculoskeletal: No lower extremity tenderness nor edema.  No joint effusions. Neurologic:  Normal speech and language. No gross focal neurologic deficits are appreciated. No gait instability. Skin:  Skin is warm, dry and intact. No rash noted. Psychiatric: Mood and affect are normal. Speech and behavior are normal.  ____________________________________________   LABS (all labs ordered are listed, but  only abnormal results are displayed)  Labs Reviewed  URINALYSIS COMPLETEWITH MICROSCOPIC ([facility] ONLY) - Abnormal; Notable for the following:       Result Value   Color, Urine YELLOW (*)    APPearance CLEAR (*)    Leukocytes, UA TRACE (*)    All other components within normal limits  CHLAMYDIA/NGC RT PCR ([facility] ONLY)     ____________________________________________  EKG   ____________________________________________  RADIOLOGY   ____________________________________________   PROCEDURES  Procedure(s) performed: None  Procedures  Critical Care performed: No  ____________________________________________   INITIAL IMPRESSION / ASSESSMENT AND PLAN / ED COURSE  Pertinent labs & imaging results that were available during my care of the patient were reviewed by me and considered in my medical decision making (see chart for details).  Dysuria. Patient's urinalysis was unremarkable. GC chlamydia is pending. Patient given discharge care instructions. Patient given prescription for Pyridium. Patient will follow-up with urology if symptoms persist more than 5 days.  Clinical Course  Discussed patient history, complaint, and urine results. Advised patient will be notified if GC comes back positive.   ____________________________________________   FINAL CLINICAL IMPRESSION(S) / ED DIAGNOSES  Final diagnoses:  Dysuria      NEW MEDICATIONS STARTED DURING THIS VISIT:  New Prescriptions   No medications on file     Note:  This document was prepared using Dragon voice recognition software and may include unintentional dictation errors.      Joni Reining, PA-C 08/18/16 1605    Nita Sickle, MD 08/19/16 [phone]

## 2016-08-18 NOTE — ED Triage Notes (Signed)
C/O burning with urination since this morning.  Green discharge from penis that started this morning.

## 2016-10-09 ENCOUNTER — Emergency Department
Admission: EM | Admit: 2016-10-09 | Discharge: 2016-10-09 | Disposition: A | Discharge status: Home / Self Care | Payer: Self-pay | Attending: Emergency Medicine | Admitting: Emergency Medicine

## 2016-10-09 ENCOUNTER — Encounter: Payer: Self-pay | Admitting: *Deleted

## 2016-10-09 DIAGNOSIS — H109 Unspecified conjunctivitis: Secondary | ICD-10-CM | POA: Insufficient documentation

## 2016-10-09 DIAGNOSIS — Z79899 Other long term (current) drug therapy: Secondary | ICD-10-CM | POA: Insufficient documentation

## 2016-10-09 DIAGNOSIS — Z87891 Personal history of nicotine dependence: Secondary | ICD-10-CM | POA: Insufficient documentation

## 2016-10-09 MED ORDER — CIPROFLOXACIN HCL 0.3 % OP SOLN
2.0000 [drp] | Freq: Once | OPHTHALMIC | Status: AC
  Administered 2016-10-09: 2 [drp] via OPHTHALMIC
  Filled 2016-10-09 (×2): qty 2.5

## 2016-10-09 NOTE — ED Provider Notes (Signed)
[facility] Emergency Department Provider Note   First MD Initiated Contact with Patient 10/09/16 0151     (approximate)  I have reviewed the triage vital signs and the nursing notes.   HISTORY  Chief Complaint Conjunctivitis    HPI Dennis Edwards is a 20 y.o. male presents with left eye foreign body sensation noted yesterday with redness and mattering of eyelashes on awakening this morning. Patient also admits to left eye pruritus and drainage. Patient afebrile on presentation with temperature 90.8 point  No past medical history on file.  There are no active problems to display for this patient.   No past surgical history on file.  Prior to Admission medications   Medication Sig Start Date End Date Taking? Authorizing Provider  diphenhydrAMINE (BENADRYL) 25 MG tablet Take 25 mg by mouth every 6 (six) hours as needed. For allergies     Historical Provider, MD  ibuprofen (ADVIL,MOTRIN) 200 MG tablet Take 400 mg by mouth every 6 (six) hours as needed. For pain     Historical Provider, MD  phenazopyridine (PYRIDIUM) 200 MG tablet Take 1 tablet (200 mg total) by mouth 3 (three) times daily as needed for pain. 08/18/16   Joni Reiningonald K Smith, PA-C  sulfamethoxazole-trimethoprim (BACTRIM) 400-80 MG per tablet Take 1 tablet by mouth 2 (two) times daily. For 3 days 06/19/15   Renford DillsLindsey Miller, NP    Allergies Patient has no known allergies.  No family history on file.  Social History Social History  Substance Use Topics  . Smoking status: Former Games developermoker  . Smokeless tobacco: Never Used  . Alcohol use No    Review of Systems Constitutional: No fever/chills Eyes: No visual changes.Positive for left eye redness for an body sensation ENT: No sore throat. Cardiovascular: Denies chest pain. Respiratory: Denies shortness of breath. Gastrointestinal: No abdominal pain.  No nausea, no vomiting.  No diarrhea.  No constipation. Genitourinary: Negative for  dysuria. Musculoskeletal: Negative for back pain. Skin: Negative for rash. Neurological: Negative for headaches, focal weakness or numbness.  10-point ROS otherwise negative.  ____________________________________________   PHYSICAL EXAM:  VITAL SIGNS: ED Triage Vitals  Enc Vitals Group     BP 10/09/16 0023 120/70     Pulse Rate 10/09/16 0023 68     Resp 10/09/16 0023 20     Temp 10/09/16 0023 98.1 F (36.7 C)     Temp src --      SpO2 10/09/16 0023 99 %     Weight 10/09/16 0023 135 lb (61.2 kg)     Height 10/09/16 0023 6\' 1"  (1.854 m)     Head Circumference --      Peak Flow --      Pain Score 10/09/16 0024 6     Pain Loc --      Pain Edu? --      Excl. in GC? --     Constitutional: Alert and oriented. Well appearing and in no acute distress. Eyes: Conjunctivae erythema PERRL. EOMI. Head: Atraumatic. Nose: No congestion/rhinnorhea. Mouth/Throat: Mucous membranes are moist.  Oropharynx non-erythematous. Neck: No stridor.   Musculoskeletal: No lower extremity tenderness nor edema. No gross deformities of extremities. Neurologic:  Normal speech and language. No gross focal neurologic deficits are appreciated.  Skin:  Skin is warm, dry and intact. No rash noted. Psychiatric: Mood and affect are normal. Speech and behavior are normal.      Procedures    INITIAL IMPRESSION / ASSESSMENT AND PLAN / ED COURSE  Pertinent labs & imaging results that were available during my care of the patient were reviewed by me and considered in my medical decision making (see chart for details).     Clinical Course     ____________________________________________  FINAL CLINICAL IMPRESSION(S) / ED DIAGNOSES  Final diagnoses:  Bacterial conjunctivitis of left eye     MEDICATIONS GIVEN DURING THIS VISIT:  Medications  ciprofloxacin (CILOXAN) 0.3 % ophthalmic solution 2 drop (not administered)     NEW OUTPATIENT MEDICATIONS STARTED DURING THIS VISIT:  New  Prescriptions   No medications on file    Modified Medications   No medications on file    Discontinued Medications   No medications on file     Note:  This document was prepared using Dragon voice recognition software and may include unintentional dictation errors.    Darci Currentandolph N Brown, MD 10/09/16 Earle [id number]

## 2016-10-09 NOTE — ED Triage Notes (Signed)
Pt has itching of left eye.  Pt reports drainage from left eye.

## 2016-12-04 ENCOUNTER — Encounter: Payer: Self-pay | Admitting: Emergency Medicine

## 2016-12-04 ENCOUNTER — Emergency Department
Admission: EM | Admit: 2016-12-04 | Discharge: 2016-12-04 | Disposition: A | Discharge status: Home / Self Care | Payer: Self-pay | Attending: Emergency Medicine | Admitting: Emergency Medicine

## 2016-12-04 DIAGNOSIS — Z79899 Other long term (current) drug therapy: Secondary | ICD-10-CM | POA: Insufficient documentation

## 2016-12-04 DIAGNOSIS — Z87891 Personal history of nicotine dependence: Secondary | ICD-10-CM | POA: Insufficient documentation

## 2016-12-04 DIAGNOSIS — J029 Acute pharyngitis, unspecified: Secondary | ICD-10-CM | POA: Insufficient documentation

## 2016-12-04 DIAGNOSIS — Z791 Long term (current) use of non-steroidal anti-inflammatories (NSAID): Secondary | ICD-10-CM | POA: Insufficient documentation

## 2016-12-04 NOTE — ED Notes (Signed)
Pt alert and oriented X4, active, cooperative, pt in NAD. RR even and unlabored, color WNL.  Pt informed to return if any life threatening symptoms occur.   

## 2016-12-04 NOTE — ED Notes (Signed)
Patient c/o sore throat, nasal congestion, intermittent productive cough with brown sputum, and SOB (while trying to fall asleep) X 2 days,

## 2016-12-04 NOTE — ED Triage Notes (Signed)
Pt ambulatory to triage in nad, report sore throat x 2 days, states neck feels tight and pulls on back of the head.  Pt NAD at this time.

## 2016-12-04 NOTE — ED Notes (Signed)
POC Strep NEGATIVE 

## 2016-12-05 NOTE — ED Provider Notes (Signed)
[facility] Emergency Department Provider Note  ____________________________________________  Time seen: Approximately 1:39 AM  I have reviewed the triage vital signs and the nursing notes.   HISTORY  Chief Complaint Sore Throat    HPI Dennis Edwards is a 21 y.o. male presenting to the emergency department with pharyngitis for the past two days. Additional symptoms include rhinorrhea and congestion. He denies fever, cough, chest pain, shortness of breath, nausea, vomiting, diarrhea and constipation. Patient's states, "I need a work note". Patient has tried throat lozenges, which has mildly relieved his pain. Patient rates pharyngitis pain at 6 out of 10 in intensity and describes it as sore. Patient denies sick contacts or recent travel.    History reviewed. No pertinent past medical history.  There are no active problems to display for this patient.   History reviewed. No pertinent surgical history.  Prior to Admission medications   Medication Sig Start Date End Date Taking? Authorizing Provider  diphenhydrAMINE (BENADRYL) 25 MG tablet Take 25 mg by mouth every 6 (six) hours as needed. For allergies     Historical Provider, MD  ibuprofen (ADVIL,MOTRIN) 200 MG tablet Take 400 mg by mouth every 6 (six) hours as needed. For pain     Historical Provider, MD  phenazopyridine (PYRIDIUM) 200 MG tablet Take 1 tablet (200 mg total) by mouth 3 (three) times daily as needed for pain. 08/18/16   Joni Reiningonald K Smith, PA-C  sulfamethoxazole-trimethoprim (BACTRIM) 400-80 MG per tablet Take 1 tablet by mouth 2 (two) times daily. For 3 days 06/19/15   Renford DillsLindsey Miller, NP    Allergies Patient has no known allergies.  History reviewed. No pertinent family history.  Social History Social History  Substance Use Topics  . Smoking status: Former Games developermoker  . Smokeless tobacco: Never Used  . Alcohol use No     Review of Systems  Constitutional: No fever/chills Eyes: No visual  changes. No discharge ENT: Patient has pharyngitis  Cardiovascular: no chest pain. Respiratory: no cough. No SOB. Gastrointestinal: No abdominal pain.  No nausea, no vomiting. No diarrhea.  No constipation. Musculoskeletal: Negative for musculoskeletal pain. Skin: Negative for rash, abrasions, lacerations, ecchymosis. Neurological: Negative for headaches, focal weakness or numbness. 10-point ROS otherwise negative.  ____________________________________________   PHYSICAL EXAM:  VITAL SIGNS: ED Triage Vitals [12/04/16 1929]  Enc Vitals Group     BP 126/63     Pulse Rate 61     Resp 18     Temp 97.9 F (36.6 C)     Temp Source Oral     SpO2 99 %     Weight 140 lb (63.5 kg)     Height 6\' 1"  (1.854 m)     Head Circumference      Peak Flow      Pain Score 8     Pain Loc      Pain Edu?      Excl. in GC?      Constitutional: Alert and oriented. Patient is using his phone throughout encounter.  Eyes: Conjunctivae are normal. PERRL. EOMI. Head: Atraumatic. ENT:      Ears: Tympanic membranes are pearly bilaterally with no evidence of infection or effusion bilaterally. Bony landmarks are visualized bilaterally.      Nose: Nasal turbinates are edematous and erythematous. Trace rhinorrhea visualized..      Mouth/Throat: Mucous membranes are moist. Posterior pharynx is mildly erythematous. No tonsillar hypertrophy or purulent exudate. Uvula is midline. Neck: Full range of motion. No pain  is elicited with flexion at the neck. Hematological/Lymphatic/Immunilogical: No cervical lymphadenopathy. Cardiovascular: Normal rate, regular rhythm. Normal S1 and S2.  Good peripheral circulation. Respiratory: Normal respiratory effort without tachypnea or retractions. Lungs CTAB. Good air entry to the bases with no decreased or absent breath sounds. Skin:  Skin is warm, dry and intact. No rash noted. Psychiatric: Mood and affect are normal. Speech and behavior are normal. Patient exhibits  appropriate insight and judgement.   ____________________________________________   LABS (all labs ordered are listed, but only abnormal results are displayed)  Labs Reviewed  CULTURE, GROUP A STREP [facility])   ____________________________________________  EKG   ____________________________________________  RADIOLOGY   No results found.  ____________________________________________    PROCEDURES  Procedure(s) performed:    Procedures    Medications - No data to display   ____________________________________________   INITIAL IMPRESSION / ASSESSMENT AND PLAN / ED COURSE  Pertinent labs & imaging results that were available during my care of the patient were reviewed by me and considered in my medical decision making (see chart for details).  Review of the [place] CSRS was performed in accordance of the [facility] prior to dispensing any controlled drugs.  Clinical Course    Assessment and plan:  Viral pharyngitis: Patient presents with two days of pharyngitis. Additional symptoms include rhinorrhea and congestion. Patient's rapid strep was negative in the emergency department. A viral etiology to patient's symptoms is likely. Rest and hydration were encouraged. All patient questions were answered. Vital signs are reassuring at this time.   ____________________________________________  FINAL CLINICAL IMPRESSION(S) / ED DIAGNOSES  Final diagnoses:  Pharyngitis, unspecified etiology      NEW MEDICATIONS STARTED DURING THIS VISIT:  Discharge Medication List as of 12/04/2016  8:24 PM          This chart was dictated using voice recognition software/Dragon. Despite best efforts to proofread, errors can occur which can change the meaning. Any change was purely unintentional.    Orvil Feil, PA-C 12/05/16 0147    Jennye Moccasin, MD 12/05/16 [phone]

## 2016-12-07 LAB — CULTURE, GROUP A STREP (THRC)

## 2018-12-15 ENCOUNTER — Other Ambulatory Visit: Payer: Self-pay

## 2018-12-15 ENCOUNTER — Emergency Department
Admission: EM | Admit: 2018-12-15 | Discharge: 2018-12-15 | Disposition: A | Discharge status: Home / Self Care | Payer: No Typology Code available for payment source | Attending: Emergency Medicine | Admitting: Emergency Medicine

## 2018-12-15 ENCOUNTER — Emergency Department: Payer: No Typology Code available for payment source

## 2018-12-15 ENCOUNTER — Encounter: Payer: Self-pay | Admitting: Emergency Medicine

## 2018-12-15 DIAGNOSIS — Y9389 Activity, other specified: Secondary | ICD-10-CM | POA: Diagnosis not present

## 2018-12-15 DIAGNOSIS — R51 Headache: Secondary | ICD-10-CM | POA: Diagnosis present

## 2018-12-15 DIAGNOSIS — M7918 Myalgia, other site: Secondary | ICD-10-CM | POA: Diagnosis not present

## 2018-12-15 DIAGNOSIS — Z87891 Personal history of nicotine dependence: Secondary | ICD-10-CM | POA: Insufficient documentation

## 2018-12-15 DIAGNOSIS — Y92410 Unspecified street and highway as the place of occurrence of the external cause: Secondary | ICD-10-CM | POA: Diagnosis not present

## 2018-12-15 DIAGNOSIS — Y999 Unspecified external cause status: Secondary | ICD-10-CM | POA: Diagnosis not present

## 2018-12-15 MED ORDER — IBUPROFEN 600 MG PO TABS
600.0000 mg | ORAL_TABLET | Freq: Once | ORAL | Status: AC
  Administered 2018-12-15: 600 mg via ORAL
  Filled 2018-12-15: qty 1

## 2018-12-15 MED ORDER — IBUPROFEN 600 MG PO TABS: 600.0000 mg | ORAL_TABLET | Freq: Four times a day (QID) | ORAL | 0 refills | Status: DC | PRN

## 2018-12-15 MED ORDER — CYCLOBENZAPRINE HCL 5 MG PO TABS: ORAL_TABLET | ORAL | 0 refills | Status: DC
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
## 2019-04-04 ENCOUNTER — Other Ambulatory Visit: Payer: Self-pay

## 2019-04-04 ENCOUNTER — Encounter: Payer: Self-pay | Admitting: Emergency Medicine

## 2019-04-04 ENCOUNTER — Emergency Department
Admission: EM | Admit: 2019-04-04 | Discharge: 2019-04-04 | Disposition: A | Discharge status: Home / Self Care | Payer: Self-pay | Attending: Emergency Medicine | Admitting: Emergency Medicine

## 2019-04-04 DIAGNOSIS — Y93G1 Activity, food preparation and clean up: Secondary | ICD-10-CM | POA: Insufficient documentation

## 2019-04-04 DIAGNOSIS — Z87891 Personal history of nicotine dependence: Secondary | ICD-10-CM | POA: Insufficient documentation

## 2019-04-04 DIAGNOSIS — S61011A Laceration without foreign body of right thumb without damage to nail, initial encounter: Secondary | ICD-10-CM | POA: Insufficient documentation

## 2019-04-04 DIAGNOSIS — Y929 Unspecified place or not applicable: Secondary | ICD-10-CM | POA: Insufficient documentation

## 2019-04-04 DIAGNOSIS — Y999 Unspecified external cause status: Secondary | ICD-10-CM | POA: Insufficient documentation

## 2019-04-04 DIAGNOSIS — W260XXA Contact with knife, initial encounter: Secondary | ICD-10-CM | POA: Insufficient documentation

## 2019-04-04 MED ORDER — CEPHALEXIN 500 MG PO CAPS
500.0000 mg | ORAL_CAPSULE | Freq: Once | ORAL | Status: AC
  Administered 2019-04-04: 500 mg via ORAL
  Filled 2019-04-04: qty 1

## 2019-04-04 MED ORDER — CEPHALEXIN 500 MG PO CAPS: 500.0000 mg | ORAL_CAPSULE | Freq: Four times a day (QID) | ORAL | 0 refills | Status: AC

## 2019-04-04 MED ORDER — LIDOCAINE HCL (PF) 1 % IJ SOLN
5.0000 mL | Freq: Once | INTRAMUSCULAR | Status: AC
  Administered 2019-04-04: 5 mL
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
## 2019-04-04 NOTE — ED Provider Notes (Signed)
[facility] Emergency Department Provider Note  ____________________________________________   First MD Initiated Contact with Patient 04/04/19 [phone]     (approximate)  I have reviewed the triage vital signs and the nursing notes.   HISTORY  Chief Complaint Laceration    HPI Dennis Edwards is a 23 y.o. male with no chronic medical issues who presents for evaluation of a laceration at the base of his right thumb.  He accidentally cut himself while trying to cut a bagel.  He is right-hand dominant.  Bleeding is well controlled.  No numbness nor tingling.  He is able to bend his thumb but he does not want to because it hurts.  The knife was clean.  He says he is up-to-date  on his tetanus vaccination.  He denies any other symptoms.        History reviewed. No pertinent past medical history.  There are no active problems to display for this patient.   History reviewed. No pertinent surgical history.  Prior to Admission medications   Medication Sig Start Date End Date Taking? Authorizing Provider  cephALEXin (KEFLEX) 500 MG capsule Take 1 capsule (500 mg total) by mouth 4 (four) times daily for 5 days. 04/04/19 04/09/19  Loleta Rose, MD  cyclobenzaprine (FLEXERIL) 5 MG tablet Take 1-2 tablets 3 times daily as needed 12/15/18   Enid Derry, PA-C  diphenhydrAMINE (BENADRYL) 25 MG tablet Take 25 mg by mouth every 6 (six) hours as needed. For allergies     [provider]  ibuprofen (ADVIL,MOTRIN) 600 MG tablet Take 1 tablet (600 mg total) by mouth every 6 (six) hours as needed. 12/15/18   Enid Derry, PA-C  phenazopyridine (PYRIDIUM) 200 MG tablet Take 1 tablet (200 mg total) by mouth 3 (three) times daily as needed for pain. 08/18/16   Joni Reining, PA-C  sulfamethoxazole-trimethoprim (BACTRIM) 400-80 MG per tablet Take 1 tablet by mouth 2 (two) times daily. For 3 days 06/19/15   Renford Dills, NP    Allergies Patient has no known allergies.   No family history on file.  Social History Social History   Tobacco Use  . Smoking status: Former Games developer  . Smokeless tobacco: Never Used  Substance Use Topics  . Alcohol use: No  . Drug use: No    Review of Systems Constitutional: No fever/chills Cardiovascular: Denies chest pain. Respiratory: Denies shortness of breath. Gastrointestinal: No abdominal pain. Musculoskeletal: Negative for neck pain.  Negative for back pain. Integumentary: Laceration to the top of the right thumb Neurological: Negative for headaches, focal weakness or numbness.   ____________________________________________   PHYSICAL EXAM:  VITAL SIGNS: ED Triage Vitals  Enc Vitals Group     BP 04/04/19 0355 93/73     Pulse Rate 04/04/19 0355 94     Resp 04/04/19 0355 16     Temp 04/04/19 0355 98.2 F (36.8 C)     Temp Source 04/04/19 0355 Oral     SpO2 04/04/19 0355 95 %     Weight 04/04/19 0346 56.7 kg (125 lb)     Height 04/04/19 0346 1.854 m (6\' 1" )     Head Circumference --      Peak Flow --      Pain Score 04/04/19 0345 9     Pain Loc --      Pain Edu? --      Excl. in GC? --     Constitutional: Alert and oriented. Well appearing and in no acute distress.  Neck: No stridor.  No meningeal signs.   Cardiovascular: Normal rate, regular rhythm. Good peripheral circulation. Respiratory: Normal respiratory effort.   Musculoskeletal: No lower extremity tenderness nor edema. No gross deformities of extremities. Neurologic:  Normal speech and language. No gross focal neurologic deficits are appreciated.  Skin:  Skin is warm and dry.  The patient has a 1.2 cm laceration to the first phalanx of the right thumb near the base of the thumb.  I can see what appears to be a tendon sheath move when he flexes his thumb but he has no limitation of range of motion.  The wound is clean and bleeding is well controlled. Psychiatric: Mood and affect are normal. Speech and behavior are normal.   ____________________________________________   LABS (all labs ordered are listed, but only abnormal results are displayed)  Labs Reviewed - No data to display ____________________________________________  EKG  No indication for EKG ____________________________________________  RADIOLOGY   ED MD interpretation: No indication for imaging  Official radiology report(s): No results found.  ____________________________________________   PROCEDURES   Procedure(s) performed (including Critical Care):  Marland Kitchen.Marland Kitchen.Laceration Repair Date/Time: 04/04/2019 5:26 AM Performed by: Loleta RoseForbach, Tammi Boulier, MD Authorized by: Loleta RoseForbach, Ramonte Mena, MD   Consent:    Consent obtained:  Verbal   Consent given by:  Patient   Risks discussed:  Infection, pain, retained foreign body, poor cosmetic result, poor wound healing and tendon damage Anesthesia (see MAR for exact dosages):    Anesthesia method:  Local infiltration   Local anesthetic:  Lidocaine 1% w/o epi Laceration details:    Location:  Finger   Finger location:  R thumb   Length (cm):  1.2 Repair type:    Repair type:  Simple Exploration:    Hemostasis achieved with:  Direct pressure   Wound exploration: entire depth of wound probed and visualized     Contaminated: no   Treatment:    Area cleansed with:  Saline   Amount of cleaning:  Extensive   Irrigation solution:  Tap water   Visualized foreign bodies/material removed: no   Skin repair:    Repair method:  Sutures   Suture size:  5-0   Wound skin closure material used: Ethilon.   Suture technique:  Simple interrupted   Number of sutures:  3 Approximation:    Approximation:  Close Post-procedure details:    Patient tolerance of procedure:  Tolerated well, no immediate complications     ____________________________________________   INITIAL IMPRESSION / MDM / ASSESSMENT AND PLAN / ED COURSE  As part of my medical decision making, I reviewed the following data within the electronic  MEDICAL RECORD NUMBER Nursing notes reviewed and incorporated, Old chart reviewed and Notes from prior ED visits      *Dennis Edwards was evaluated in Emergency Department on 04/04/2019 for the symptoms described in the history of present illness. He was evaluated in the context of the global COVID-19 pandemic, which necessitated consideration that the patient might be at risk for infection with the SARS-CoV-2 virus that causes COVID-19. Institutional protocols and algorithms that pertain to the evaluation of patients at risk for COVID-19 are in a state of rapid change based on information released by regulatory bodies including the CDC and federal and state organizations. These policies and algorithms were followed during the patient's care in the ED.*  Laceration repaired without difficulty.  No signs of neurovascular damage nor of any tendon or ligament damage.  I encourage close outpatient follow-up and gave information about  a hand specialist if he is concerned he needs to see someone.  Otherwise I instructed him to have the sutures removed in 5 to 7 days.  Keflex 500 mg by mouth for prophylaxis and I gave him a 5-day course of antibiotics to reduce the possibility that his dominant hand will become infected.  He understands and agrees with the plan.      ____________________________________________  FINAL CLINICAL IMPRESSION(S) / ED DIAGNOSES  Final diagnoses:  Thumb laceration, right, initial encounter     MEDICATIONS GIVEN DURING THIS VISIT:  Medications  cephALEXin (KEFLEX) capsule 500 mg (has no administration in time range)  lidocaine (PF) (XYLOCAINE) 1 % injection 5 mL (5 mLs Other Given 04/04/19 0446)     ED Discharge Orders         Ordered    cephALEXin (KEFLEX) 500 MG capsule  4 times daily     04/04/19 1610           Note:  This document was prepared using Dragon voice recognition software and may include unintentional dictation errors.   Loleta Rose, MD 04/04/19  [phone]

## 2019-04-04 NOTE — ED Triage Notes (Signed)
Patient ambulatory to triage with steady gait, without difficulty or distress noted; pt reports cutting right thumb on knife while cooking PTA; dressing applied

## 2019-07-03 ENCOUNTER — Other Ambulatory Visit: Payer: Self-pay

## 2019-07-03 ENCOUNTER — Emergency Department
Admission: EM | Admit: 2019-07-03 | Discharge: 2019-07-03 | Disposition: A | Discharge status: Home / Self Care | Payer: Self-pay | Attending: Emergency Medicine | Admitting: Emergency Medicine

## 2019-07-03 ENCOUNTER — Encounter: Payer: Self-pay | Admitting: Emergency Medicine

## 2019-07-03 DIAGNOSIS — Z87891 Personal history of nicotine dependence: Secondary | ICD-10-CM | POA: Insufficient documentation

## 2019-07-03 DIAGNOSIS — Z202 Contact with and (suspected) exposure to infections with a predominantly sexual mode of transmission: Secondary | ICD-10-CM | POA: Insufficient documentation

## 2019-07-03 DIAGNOSIS — Z711 Person with feared health complaint in whom no diagnosis is made: Secondary | ICD-10-CM

## 2019-07-03 LAB — CHLAMYDIA/NGC RT PCR (ARMC ONLY)
Chlamydia Tr: DETECTED — AB
N gonorrhoeae: NOT DETECTED

## 2019-07-03 MED ORDER — AZITHROMYCIN 500 MG PO TABS
1000.0000 mg | ORAL_TABLET | Freq: Once | ORAL | Status: AC
  Administered 2019-07-03: 18:00:00 1000 mg via ORAL

## 2019-07-03 MED ORDER — METRONIDAZOLE 500 MG PO TABS: 2000.0000 mg | ORAL_TABLET | Freq: Once | ORAL | 0 refills | Status: AC

## 2019-07-03 MED ORDER — AZITHROMYCIN 500 MG PO TABS
ORAL_TABLET | ORAL | Status: AC
  Filled 2019-07-03: qty 2

## 2019-07-03 MED ORDER — AZITHROMYCIN 1 G PO PACK: 1.0000 g | PACK | Freq: Once | ORAL | Status: DC

## 2019-07-03 MED ORDER — CEFTRIAXONE SODIUM 250 MG IJ SOLR
250.0000 mg | Freq: Once | INTRAMUSCULAR | Status: AC
  Administered 2019-07-03: 250 mg via INTRAMUSCULAR
  Filled 2019-07-03: qty 250
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
## 2019-07-03 NOTE — ED Provider Notes (Signed)
[facility] Emergency Department Provider Note ____________________________________________  Time seen: 1740  I have reviewed the triage vital signs and the nursing notes.  HISTORY  Chief Complaint  Exposure to STD  History as told to Page Spiroaitlin Rodgers, PA-S Sherrie Sport([place])  HPI Dennis Edwards is a 23 y.o. male presents to the ED for evaluation management after STD exposure.  Patient was notified by a male partner from 1 to 2 weeks ago, that he should be evaluated for symptoms of an STD.  He is not aware of the exact exposure.  He does note that he has had 1 day of dysuria and penile discharge.  He denies any fevers, chills, sweats.  Also denies any hematuria, urinary retention, or penile lesions.  History reviewed. No pertinent past medical history.  There are no active problems to display for this patient.  History reviewed. No pertinent surgical history.  Prior to Admission medications   Medication Sig Start Date End Date Taking? Authorizing Provider  metroNIDAZOLE (FLAGYL) 500 MG tablet Take 4 tablets (2,000 mg total) by mouth once for 1 dose. 07/03/19 07/03/19  Derriana Oser, Charlesetta IvoryJenise V Bacon, PA-C    Allergies Patient has no known allergies.  No family history on file.  Social History Social History   Tobacco Use  . Smoking status: Former Games developermoker  . Smokeless tobacco: Never Used  Substance Use Topics  . Alcohol use: No  . Drug use: No    Review of Systems  Constitutional: Negative for fever. Eyes: Negative for visual changes. ENT: Negative for sore throat. Cardiovascular: Negative for chest pain. Respiratory: Negative for shortness of breath. Gastrointestinal: Negative for abdominal pain, vomiting and diarrhea. Genitourinary: Negative for dysuria.  Reports penile discharge as above. Musculoskeletal: Negative for back pain. Skin: Negative for rash. Neurological: Negative for headaches, focal weakness or  numbness. ____________________________________________  PHYSICAL EXAM:  VITAL SIGNS: ED Triage Vitals  Enc Vitals Group     BP 07/03/19 1603 102/62     Pulse Rate 07/03/19 1603 73     Resp 07/03/19 1603 16     Temp 07/03/19 1603 98.3 F (36.8 C)     Temp Source 07/03/19 1603 Oral     SpO2 07/03/19 1603 98 %     Weight 07/03/19 1602 145 lb (65.8 kg)     Height 07/03/19 1602 6\' 1"  (1.854 m)     Head Circumference --      Peak Flow --      Pain Score 07/03/19 1602 0     Pain Loc --      Pain Edu? --      Excl. in GC? --     Constitutional: Alert and oriented. Well appearing and in no distress. Head: Normocephalic and atraumatic. Eyes: Conjunctivae are normal. Normal extraocular movements Cardiovascular: Normal rate, regular rhythm. Normal distal pulses. Respiratory: Normal respiratory effort. No wheezes/rales/rhonchi. GU: deferred Musculoskeletal: Nontender with normal range of motion in all extremities.  Neurologic:  Normal gait without ataxia. Normal speech and language. No gross focal neurologic deficits are appreciated. Skin:  Skin is warm, dry and intact. No rash noted. Psychiatric: Mood and affect are normal. Patient exhibits appropriate insight and judgment. ____________________________________________   LABS (pertinent positives/negatives) Labs Reviewed  CHLAMYDIA/NGC RT PCR ([facility] ONLY)  __________________________________________  PROCEDURES  Procedures Azithromycin 1 g PO Rocephin 250 mg IM ____________________________________________  INITIAL IMPRESSION / ASSESSMENT AND PLAN / ED COURSE  Dennis Edwards was evaluated in Emergency Department on 07/03/2019 for the symptoms described in  the history of present illness. He was evaluated in the context of the global COVID-19 pandemic, which necessitated consideration that the patient might be at risk for infection with the SARS-CoV-2 virus that causes COVID-19. Institutional protocols and algorithms that  pertain to the evaluation of patients at risk for COVID-19 are in a state of rapid change based on information released by regulatory bodies including the CDC and federal and state organizations. These policies and algorithms were followed during the patient's care in the ED.  Patient with ED evaluation of STD exposure.  Patient was treated empirically for gonorrhea chlamydia.  GC culture is pending at time of his discharge.  Patient is also being treated empirically for trichomoniasis.  A prescription for metronidazole is provided for his benefit.  He is referred to the [facility] for ongoing symptoms.  He is advised to refrain from any sexual intercourse or contact, for the next 7 days, and until all partners have been treated and are symptom-free. ____________________________________________  FINAL CLINICAL IMPRESSION(S) / ED DIAGNOSES  Final diagnoses:  Concern about STD in male without diagnosis      Carmie End, Dannielle Karvonen, PA-C 07/03/19 1851    Harvest Dark, MD 07/03/19 2251

## 2019-07-03 NOTE — Discharge Instructions (Signed)
You have been treated for an exposure to STDs. You will also be given an antibiotic for the treatment of trichomoniasis. You should avoid sex for the next 7 days, and until all partners are treated and symptom-free. Follow-up with the [facility] Department as needed.

## 2019-07-03 NOTE — ED Triage Notes (Signed)
Wants to be checked for STD.  Possible exposure.

## 2019-07-04 ENCOUNTER — Telehealth: Payer: Self-pay | Admitting: Emergency Medicine

## 2019-07-04 NOTE — Telephone Encounter (Signed)
calle dpatient to inform of positive chlamydia.  Was treated while in the ED.  Left message asking him to call me.

## 2019-07-13 ENCOUNTER — Emergency Department
Admission: EM | Admit: 2019-07-13 | Discharge: 2019-07-13 | Disposition: A | Discharge status: Home / Self Care | Payer: Self-pay | Attending: Student in an Organized Health Care Education/Training Program | Admitting: Student in an Organized Health Care Education/Training Program

## 2019-07-13 ENCOUNTER — Other Ambulatory Visit: Payer: Self-pay

## 2019-07-13 DIAGNOSIS — Z87891 Personal history of nicotine dependence: Secondary | ICD-10-CM | POA: Insufficient documentation

## 2019-07-13 DIAGNOSIS — H1033 Unspecified acute conjunctivitis, bilateral: Secondary | ICD-10-CM | POA: Insufficient documentation

## 2019-07-13 MED ORDER — SULFACETAMIDE SODIUM 10 % OP SOLN: 2.0000 [drp] | Freq: Four times a day (QID) | OPHTHALMIC | 0 refills | Status: AC

## 2019-07-13 NOTE — ED Triage Notes (Signed)
Pt comes via POV from home with c/o possible pink eye. Pt states he woke up this am with pain, redness and discharge in left eye.

## 2019-07-13 NOTE — ED Notes (Signed)
Visual Acuity:  Left- 20/20 Right-20/20

## 2019-07-14 NOTE — ED Provider Notes (Signed)
[facility] Emergency Department Provider Note ____________________________________________  Time seen: Approximately 3:34 PM  I have reviewed the triage vital signs and the nursing notes.   HISTORY  Chief Complaint Eye Pain   HPI Ankur L Blick is a 23 y.o. male presenting to the emergency department for treatment and evaluation of left eye itching and discharge.  He will woke up this morning and had some matting and crusting in the eye.  He states that he has been around some children and likely came into contact with pinkeye from 1 of them.  No alleviating measures attempted prior to arrival.   History reviewed. No pertinent past medical history.  There are no active problems to display for this patient.   Past Surgical History:  Procedure Laterality Date  . thumb surgery      Prior to Admission medications   Medication Sig Start Date End Date Taking? Authorizing Provider  sulfacetamide (BLEPH-10) 10 % ophthalmic solution Place 2 drops into the right eye 4 (four) times daily for 10 days. 07/13/19 07/23/19  Victorino Dike, FNP    Allergies Patient has no known allergies.  No family history on file.  Social History Social History   Tobacco Use  . Smoking status: Former Research scientist (life sciences)  . Smokeless tobacco: Never Used  Substance Use Topics  . Alcohol use: No  . Drug use: No    Review of Systems   Constitutional: No fever/chills Eyes: Negative for visual changes.  Positive for pain.  Positive for drainage. Musculoskeletal: Negative for pain. Skin: Negative for rash. Neurological: Negative for headaches, focal weakness or numbness. Allergic: Negative for seasonal allergies. ____________________________________________  PHYSICAL EXAM:  VITAL SIGNS: ED Triage Vitals  Enc Vitals Group     BP 07/13/19 1255 112/69     Pulse Rate 07/13/19 1255 80     Resp 07/13/19 1255 18     Temp 07/13/19 1255 98.2 F (36.8 C)     Temp src --      SpO2  07/13/19 1255 99 %     Weight 07/13/19 1252 145 lb (65.8 kg)     Height 07/13/19 1252 6' (1.829 m)     Head Circumference --      Peak Flow --      Pain Score 07/13/19 1252 4     Pain Loc --      Pain Edu? --      Excl. in [place]? --     Constitutional: Alert and oriented. Well appearing and in no acute distress. Eyes: Visual acuity--see nursing documentation; no globe trauma; Eyelids normal to inspection; Sclera appears anicteric.  Eyelids not inverted. Conjunctiva appears erythematous to the limbus; Cornea normal on visual exam. Head: Atraumatic. Nose: No congestion/rhinnorhea. Mouth/Throat: Mucous membranes are moist.  Oropharynx non-erythematous. Respiratory: Respirations even and unlabored. Breath sounds clear to auscultation. Musculoskeletal:Normal ROM x 4 extremities. Neurologic:  Normal speech and language. No gross focal neurologic deficits are appreciated. Speech is normal. No gait instability. Skin:  Skin is warm, dry and intact. No rash noted. Psychiatric: Mood and affect are normal. Speech and behavior are normal.  ____________________________________________   LABS (all labs ordered are listed, but only abnormal results are displayed)  Labs Reviewed - No data to display ____________________________________________  EKG  Not indicated ____________________________________________  RADIOLOGY  Not indicated ____________________________________________   PROCEDURES  Procedure(s) performed: None ____________________________________________   INITIAL IMPRESSION / ASSESSMENT AND PLAN / ED COURSE  23 year old male presenting to the emergency department for treatment  and evaluation of symptoms as described in the HPI.  He will be treated with Bleph-10 and advised to follow-up with primary care, ophthalmology, or return to the emergency department for symptoms of concern  Pertinent labs & imaging results that were available during my care of the patient were reviewed  by me and considered in my medical decision making (see chart for details). ____________________________________________   FINAL CLINICAL IMPRESSION(S) / ED DIAGNOSES  Final diagnoses:  Acute conjunctivitis of both eyes, unspecified acute conjunctivitis type    Note:  This document was prepared using Dragon voice recognition software and may include unintentional dictation errors.    Chinita Pesterriplett, Markeese Boyajian B, FNP 07/14/19 1535    Willy Eddyobinson, Patrick, MD 07/17/19 1558

## 2019-08-04 ENCOUNTER — Other Ambulatory Visit: Payer: Self-pay

## 2019-08-04 ENCOUNTER — Emergency Department
Admission: EM | Admit: 2019-08-04 | Discharge: 2019-08-04 | Disposition: A | Discharge status: Home / Self Care | Payer: Self-pay | Attending: Emergency Medicine | Admitting: Emergency Medicine

## 2019-08-04 ENCOUNTER — Encounter: Payer: Self-pay | Admitting: Emergency Medicine

## 2019-08-04 DIAGNOSIS — R300 Dysuria: Secondary | ICD-10-CM | POA: Insufficient documentation

## 2019-08-04 DIAGNOSIS — Z87891 Personal history of nicotine dependence: Secondary | ICD-10-CM | POA: Insufficient documentation

## 2019-08-04 DIAGNOSIS — Z202 Contact with and (suspected) exposure to infections with a predominantly sexual mode of transmission: Secondary | ICD-10-CM | POA: Insufficient documentation

## 2019-08-04 LAB — URINALYSIS, COMPLETE (UACMP) WITH MICROSCOPIC
Bacteria, UA: NONE SEEN
Bilirubin Urine: NEGATIVE
Glucose, UA: NEGATIVE mg/dL
Hgb urine dipstick: NEGATIVE
Ketones, ur: NEGATIVE mg/dL
Nitrite: NEGATIVE
Protein, ur: NEGATIVE mg/dL
Specific Gravity, Urine: 1.016 (ref 1.005–1.030)
Squamous Epithelial / HPF: NONE SEEN (ref 0–5)
pH: 7 (ref 5.0–8.0)

## 2019-08-04 MED ORDER — AZITHROMYCIN 500 MG PO TABS
1000.0000 mg | ORAL_TABLET | Freq: Once | ORAL | Status: AC
  Administered 2019-08-04: 1000 mg via ORAL
  Filled 2019-08-04: qty 2

## 2019-08-04 MED ORDER — CEFTRIAXONE SODIUM 250 MG IJ SOLR
250.0000 mg | Freq: Once | INTRAMUSCULAR | Status: AC
  Administered 2019-08-04: 250 mg via INTRAMUSCULAR
  Filled 2019-08-04: qty 250

## 2019-08-04 NOTE — ED Triage Notes (Signed)
PT states was told by a sexual partner that she has chlamydia.

## 2019-08-04 NOTE — ED Notes (Signed)
PT states partner just tested positive for Chlamydia, denies any symptoms himself.

## 2019-08-04 NOTE — ED Provider Notes (Signed)
[facility] Emergency Department Provider Note  ____________________________________________  Time seen: Approximately 6:52 PM  I have reviewed the triage vital signs and the nursing notes.   HISTORY  Chief Complaint Exposure to STD    HPI Dennis Edwards is a 23 y.o. male who presents the emergency department with burning with urination.  Patient reports that his partner informed him that she was positive for chlamydia.  Patient has no penile drainage.  He reports that there is only slight burning at the start of his urination.  No hematuria.  Patient is never had an STD in the past.  Patient denies any other symptoms or complaints at this time.  Patient is here requesting treatment for chlamydia.  He states that he called health department but especially with COVID-19 pandemic, they advised him they cannot see him for several weeks so he presents the emergency department for treatment of his chlamydia.         History reviewed. No pertinent past medical history.  There are no active problems to display for this patient.   Past Surgical History:  Procedure Laterality Date  . thumb surgery      Prior to Admission medications   Not on File    Allergies Patient has no known allergies.  No family history on file.  Social History Social History   Tobacco Use  . Smoking status: Former Games developermoker  . Smokeless tobacco: Never Used  Substance Use Topics  . Alcohol use: No  . Drug use: No     Review of Systems  Constitutional: No fever/chills Eyes: No visual changes. No discharge ENT: No upper respiratory complaints. Cardiovascular: no chest pain. Respiratory: no cough. No SOB. Gastrointestinal: No abdominal pain.  No nausea, no vomiting.  No diarrhea.  No constipation. Genitourinary: Mild dysuria. No hematuria.  No penile drainage Musculoskeletal: Negative for musculoskeletal pain. Skin: Negative for rash, abrasions, lacerations,  ecchymosis. Neurological: Negative for headaches, focal weakness or numbness. 10-point ROS otherwise negative.  ____________________________________________   PHYSICAL EXAM:  VITAL SIGNS: ED Triage Vitals [08/04/19 1817]  Enc Vitals Group     BP (!) 150/55     Pulse Rate 96     Resp 18     Temp 98.4 F (36.9 C)     Temp src      SpO2 98 %     Weight 135 lb (61.2 kg)     Height 6\' 1"  (1.854 m)     Head Circumference      Peak Flow      Pain Score 0     Pain Loc      Pain Edu?      Excl. in GC?      Constitutional: Alert and oriented. Well appearing and in no acute distress. Eyes: Conjunctivae are normal. PERRL. EOMI. Head: Atraumatic. ENT:      Ears:       Nose: No congestion/rhinnorhea.      Mouth/Throat: Mucous membranes are moist.  Neck: No stridor.    Cardiovascular: Normal rate, regular rhythm. Normal S1 and S2.  Good peripheral circulation. Respiratory: Normal respiratory effort without tachypnea or retractions. Lungs CTAB. Good air entry to the bases with no decreased or absent breath sounds. Gastrointestinal: Bowel sounds 4 quadrants. Soft and nontender to palpation. No guarding or rigidity. No palpable masses. No distention. No CVA tenderness. Genitourinary: No external genitalia exam is performed at patient request.  Patient denies any external lesions. Musculoskeletal: Full range of motion to  all extremities. No gross deformities appreciated. Neurologic:  Normal speech and language. No gross focal neurologic deficits are appreciated.  Skin:  Skin is warm, dry and intact. No rash noted. Psychiatric: Mood and affect are normal. Speech and behavior are normal. Patient exhibits appropriate insight and judgement.   ____________________________________________   LABS (all labs ordered are listed, but only abnormal results are displayed)  Labs Reviewed  URINALYSIS, COMPLETE (UACMP) WITH MICROSCOPIC - Abnormal; Notable for the following components:       Result Value   Color, Urine YELLOW (*)    APPearance CLEAR (*)    Leukocytes,Ua TRACE (*)    All other components within normal limits  GC/CHLAMYDIA PROBE AMP   ____________________________________________  EKG   ____________________________________________  RADIOLOGY   No results found.  ____________________________________________    PROCEDURES  Procedure(s) performed:    Procedures    Medications  cefTRIAXone (ROCEPHIN) injection 250 mg (has no administration in time range)  azithromycin (ZITHROMAX) tablet 1,000 mg (has no administration in time range)     ____________________________________________   INITIAL IMPRESSION / ASSESSMENT AND PLAN / ED COURSE  Pertinent labs & imaging results that were available during my care of the patient were reviewed by me and considered in my medical decision making (see chart for details).  Review of the [place] CSRS was performed in accordance of the [place] prior to dispensing any controlled drugs.           Patient's diagnosis is consistent with exposure to chlamydia, dysuria.  Patient presented to the emergency department complaining of the painful urination x2 days.  He reports that his sexual partner informed him that she had chlamydia.  No penile drainage.  Patient denied any lesions.  Patient declined external genital exam at this time..  Patient is treated with Rocephin and azithromycin at this time.  Low suspicion at this time for nongonococcal urethritis as patient has known exposure to chlamydia.  Patient will not be treated with doxycycline at this time.  Urine gonorrhea and chlamydia test is still pending at this time.  Patient is given ED precautions to return to the ED for any worsening or new symptoms.     ____________________________________________  FINAL CLINICAL IMPRESSION(S) / ED DIAGNOSES  Final diagnoses:  STD exposure  Dysuria      NEW MEDICATIONS STARTED DURING THIS VISIT:  ED Discharge  Orders    None          This chart was dictated using voice recognition software/Dragon. Despite best efforts to proofread, errors can occur which can change the meaning. Any change was purely unintentional.    Darletta Moll, PA-C 08/04/19 1906    Duffy Bruce, MD 08/05/19 1029

## 2019-08-12 LAB — GC/CHLAMYDIA PROBE AMP
Chlamydia trachomatis, NAA: POSITIVE — AB
Neisseria Gonorrhoeae by PCR: NEGATIVE

## 2019-08-14 ENCOUNTER — Telehealth: Payer: Self-pay | Admitting: Emergency Medicine

## 2019-08-14 NOTE — Telephone Encounter (Signed)
Called patient to inform him of std test results positive for chlamydia.  He was treated during ED visit.  Left message asking him to call me.

## 2019-09-21 ENCOUNTER — Other Ambulatory Visit: Payer: Self-pay

## 2019-09-21 ENCOUNTER — Emergency Department (HOSPITAL_COMMUNITY)
Admission: EM | Admit: 2019-09-21 | Discharge: 2019-09-21 | Disposition: A | Discharge status: Home / Self Care | Payer: Self-pay | Attending: Emergency Medicine | Admitting: Emergency Medicine

## 2019-09-21 ENCOUNTER — Encounter (HOSPITAL_COMMUNITY): Payer: Self-pay | Admitting: Emergency Medicine

## 2019-09-21 DIAGNOSIS — Z202 Contact with and (suspected) exposure to infections with a predominantly sexual mode of transmission: Secondary | ICD-10-CM | POA: Insufficient documentation

## 2019-09-21 DIAGNOSIS — R369 Urethral discharge, unspecified: Secondary | ICD-10-CM | POA: Insufficient documentation

## 2019-09-21 DIAGNOSIS — Z87891 Personal history of nicotine dependence: Secondary | ICD-10-CM | POA: Insufficient documentation

## 2019-09-21 LAB — URINALYSIS, ROUTINE W REFLEX MICROSCOPIC
Bacteria, UA: NONE SEEN
Bilirubin Urine: NEGATIVE
Glucose, UA: NEGATIVE mg/dL
Hgb urine dipstick: NEGATIVE
Ketones, ur: NEGATIVE mg/dL
Nitrite: NEGATIVE
Protein, ur: NEGATIVE mg/dL
Specific Gravity, Urine: 1.015 (ref 1.005–1.030)
pH: 7 (ref 5.0–8.0)

## 2019-09-21 MED ORDER — CEFTRIAXONE SODIUM 250 MG IJ SOLR
250.0000 mg | Freq: Once | INTRAMUSCULAR | Status: AC
  Administered 2019-09-21: 07:00:00 250 mg via INTRAMUSCULAR
  Filled 2019-09-21: qty 250

## 2019-09-21 MED ORDER — AZITHROMYCIN 1 G PO PACK
1.0000 g | PACK | Freq: Once | ORAL | Status: AC
  Administered 2019-09-21: 07:00:00 1 g via ORAL
  Filled 2019-09-21: qty 1

## 2019-09-21 MED ORDER — STERILE WATER FOR INJECTION IJ SOLN
INTRAMUSCULAR | Status: AC
  Administered 2019-09-21: 07:00:00 10 mL
  Filled 2019-09-21: qty 10

## 2019-09-21 NOTE — ED Triage Notes (Signed)
Patient requesting STD screening reports yellow penile discharge with mild dysuria onset yesterday , denies fever or chills .

## 2019-09-21 NOTE — ED Provider Notes (Signed)
[facility] EMERGENCY DEPARTMENT Provider Note   CSN: [id number] Arrival date & time: 09/21/19  2248     History   Chief Complaint Chief Complaint  Patient presents with  . Penile Discharge    HPI Dennis Edwards is a 23 y.o. male.      Penile Discharge This is a new problem. The current episode started less than 1 hour ago. The problem occurs constantly. The problem has not changed since onset.Pertinent negatives include no chest pain and no headaches. Nothing aggravates the symptoms. Nothing relieves the symptoms. He has tried nothing for the symptoms.    History reviewed. No pertinent past medical history.  There are no active problems to display for this patient.   Past Surgical History:  Procedure Laterality Date  . thumb surgery          Home Medications    Prior to Admission medications   Not on File    Family History No family history on file.  Social History Social History   Tobacco Use  . Smoking status: Former Research scientist (life sciences)  . Smokeless tobacco: Never Used  Substance Use Topics  . Alcohol use: No  . Drug use: No     Allergies   Patient has no known allergies.   Review of Systems Review of Systems  Cardiovascular: Negative for chest pain.  Genitourinary: Positive for discharge.  Neurological: Negative for headaches.  All other systems reviewed and are negative.    Physical Exam Updated Vital Signs BP (!) 120/56   Pulse 68   Temp 97.8 F (36.6 C) (Oral)   Resp 14   SpO2 100%   Physical Exam Vitals signs and nursing note reviewed.  Constitutional:      Appearance: He is well-developed.  HENT:     Head: Normocephalic and atraumatic.     Nose: No congestion or rhinorrhea.     Mouth/Throat:     Pharynx: No oropharyngeal exudate or posterior oropharyngeal erythema.  Eyes:     Conjunctiva/sclera: Conjunctivae normal.  Neck:     Musculoskeletal: Normal range of motion.  Cardiovascular:     Rate and Rhythm:  Normal rate.  Pulmonary:     Effort: Pulmonary effort is normal. No respiratory distress.  Abdominal:     General: There is no distension.  Genitourinary:    Penis: Normal.      Scrotum/Testes: Normal.     Comments: Chaperoned by nurse, myron Musculoskeletal: Normal range of motion.        General: No swelling or tenderness.  Skin:    General: Skin is warm and dry.  Neurological:     General: No focal deficit present.     Mental Status: He is alert.      ED Treatments / Results  Labs (all labs ordered are listed, but only abnormal results are displayed) Labs Reviewed  URINALYSIS, ROUTINE W REFLEX MICROSCOPIC - Abnormal; Notable for the following components:      Result Value   APPearance CLOUDY (*)    Leukocytes,Ua TRACE (*)    All other components within normal limits  GC/CHLAMYDIA PROBE AMP ([place]) NOT AT [facility]    EKG None  Radiology No results found.  Procedures Procedures (including critical care time)  Medications Ordered in ED Medications  cefTRIAXone (ROCEPHIN) injection 250 mg (250 mg Intramuscular Given 09/21/19 0643)  azithromycin (ZITHROMAX) powder 1 g (1 g Oral Given 09/21/19 0643)  sterile water (preservative free) injection (10 mLs  Given 09/21/19 0645)  Initial Impression / Assessment and Plan / ED Course  I have reviewed the triage vital signs and the nursing notes.  Pertinent labs & imaging results that were available during my care of the patient were reviewed by me and considered in my medical decision making (see chart for details).  Std expsoure. ppx treatment. To abstain from sex for 2 weeks until cleared.   Final Clinical Impressions(s) / ED Diagnoses   Final diagnoses:  Exposure to STD    ED Discharge Orders    None       Dennis Edwards, Barbara Cower, MD 09/21/19 [phone]

## 2019-09-21 NOTE — ED Notes (Signed)
Pt verbalized understanding of discharge.

## 2019-09-22 LAB — GC/CHLAMYDIA PROBE AMP (~~LOC~~) NOT AT ARMC
Chlamydia: POSITIVE — AB
Neisseria Gonorrhea: NEGATIVE
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
## 2020-03-20 ENCOUNTER — Other Ambulatory Visit (HOSPITAL_COMMUNITY)
Admission: RE | Admit: 2020-03-20 | Discharge: 2020-03-20 | Disposition: A | Discharge status: Home / Self Care | Payer: BC Managed Care – PPO | Source: Ambulatory Visit | Attending: Physician Assistant | Admitting: Physician Assistant

## 2020-03-20 ENCOUNTER — Ambulatory Visit: Payer: Self-pay | Admitting: *Deleted

## 2020-03-20 ENCOUNTER — Telehealth: Payer: Self-pay

## 2020-03-20 ENCOUNTER — Other Ambulatory Visit: Payer: Self-pay

## 2020-03-20 ENCOUNTER — Encounter: Payer: Self-pay | Admitting: Physician Assistant

## 2020-03-20 ENCOUNTER — Telehealth: Payer: Self-pay | Admitting: Physician Assistant

## 2020-03-20 ENCOUNTER — Ambulatory Visit (INDEPENDENT_AMBULATORY_CARE_PROVIDER_SITE_OTHER): Payer: BC Managed Care – PPO | Admitting: Physician Assistant

## 2020-03-20 VITALS — BP 108/74 | HR 99 | Temp 97.1°F | Ht 70.0 in | Wt 135.0 lb

## 2020-03-20 DIAGNOSIS — F988 Other specified behavioral and emotional disorders with onset usually occurring in childhood and adolescence: Secondary | ICD-10-CM | POA: Insufficient documentation

## 2020-03-20 DIAGNOSIS — R369 Urethral discharge, unspecified: Secondary | ICD-10-CM | POA: Diagnosis not present

## 2020-03-20 DIAGNOSIS — R110 Nausea: Secondary | ICD-10-CM | POA: Diagnosis not present

## 2020-03-20 DIAGNOSIS — A64 Unspecified sexually transmitted disease: Secondary | ICD-10-CM

## 2020-03-20 DIAGNOSIS — R824 Acetonuria: Secondary | ICD-10-CM

## 2020-03-20 DIAGNOSIS — N390 Urinary tract infection, site not specified: Secondary | ICD-10-CM

## 2020-03-20 LAB — POCT URINALYSIS DIPSTICK
Bilirubin, UA: NEGATIVE
Glucose, UA: NEGATIVE
Ketones, UA: 80
Nitrite, UA: NEGATIVE
Protein, UA: NEGATIVE
Spec Grav, UA: 1.02 (ref 1.010–1.025)
Urobilinogen, UA: 0.2 E.U./dL
pH, UA: 6.5 (ref 5.0–8.0)

## 2020-03-20 MED ORDER — CEFTRIAXONE SODIUM 500 MG IJ SOLR
500.0000 mg | Freq: Once | INTRAMUSCULAR | Status: AC
  Administered 2020-03-20: 500 mg via INTRAMUSCULAR

## 2020-03-20 MED ORDER — ONDANSETRON HCL 4 MG PO TABS: 4.0000 mg | ORAL_TABLET | Freq: Three times a day (TID) | ORAL | 0 refills | Status: DC | PRN

## 2020-03-20 MED ORDER — LIDOCAINE HCL (PF) 1 % IJ SOLN
1.0000 mL | Freq: Once | INTRAMUSCULAR | Status: AC
  Administered 2020-03-20: 1 mL

## 2020-03-20 MED ORDER — AZITHROMYCIN 500 MG PO TABS: 500.0000 mg | ORAL_TABLET | Freq: Every day | ORAL | 0 refills | Status: DC

## 2020-03-20 NOTE — Telephone Encounter (Signed)
Attempted to call patient to have him return in 2 weeks for follow up and recheck his urine. OK for [facility] to schedule.

## 2020-03-20 NOTE — Telephone Encounter (Signed)
Can we call patient and ask him if he is doing the keto diet? Some ketones on his urine. If he is not, will probably need to add blood test for him including CMET, CBC, A1C and repeat urine.

## 2020-03-20 NOTE — Telephone Encounter (Signed)
Patient was advised that the labs was placed and to come back by Friday at the latest to have blood work and urine rechecked.

## 2020-03-20 NOTE — Telephone Encounter (Signed)
Message from Tonita Phoenix sent at 03/20/2020 2:00 PM EDT  Summary: Med Managment   Pt received new medication today and would like to speak with a nurse about how to take them. Please advise.          Returned call to patient who had questions about the medicines prescribed during visit today "She told me I would be taking one pill but I was given 3 500 mg pills at the pharmacy". Prescription written for Azithromycin 500 mg to be taken once daily with 2 tabs dispensed. Clarification needed if patient should be taking 500 mg daily or if both tabs need to be taken at the same time to equal 1 g dose. Pt can be contacted at [phone].

## 2020-03-20 NOTE — Telephone Encounter (Signed)
Patient states that he is not on a keto diet and states he will come back in to have blood work with repeat urine. Please advise. Patient was calling to see if he suppose to take the Azithromycin once or three times and I advised patient that he need to take 2 (1,000 MG) tablets together once.

## 2020-03-20 NOTE — Progress Notes (Signed)
Established patient visit    Patient: Dennis Edwards   DOB: 07-Feb-1996   24 y.o. Male  MRN: [id number] Visit Date: 03/20/2020  Today's healthcare provider: Trey Sailors, PA-C   Chief Complaint  Patient presents with  . Urinary Tract Infection   Subjective    HPI Urinary symptoms  He reports new onset dysuria and urinary hesitancy. The current episode started a few days ago and is worsening. Patient states symptoms are 5/10 in intensity, occurring constantly. He  has not been recently treated for similar symptoms.    Associated symptoms: Yes abdominal pain No back pain No chills No constipation No cramping No diarrhea Yes discharge No fever No hematuria No nausea No vomiting  He reports that is started Monday with a white penile discharge. He is sexually active with typically on 2 people and is not using condoms.  He does have a history of multiple Chlamydia infections in the past. He Currently has a new partner and wanted to make sure he is clear of any infections.   ---------------------------------------------------------------------------------------       Medications: Outpatient Medications Prior to Visit  Medication Sig  . amphetamine-dextroamphetamine (ADDERALL) 10 MG tablet Take by mouth.   No facility-administered medications prior to visit.    Review of Systems  Constitutional: Negative.   HENT: Negative.   Eyes: Negative.   Respiratory: Negative.   Cardiovascular: Negative.   Gastrointestinal: Negative.   Endocrine: Negative.   Genitourinary: Positive for decreased urine volume, discharge and dysuria. Negative for flank pain, frequency, genital sores, hematuria, penile pain, penile swelling and testicular pain.  Musculoskeletal: Negative.   Skin: Negative.   Allergic/Immunologic: Negative.   Neurological: Negative.   Hematological: Negative.   Psychiatric/Behavioral: Negative.        Objective    BP 108/74   Pulse 99   Temp (!)  97.1 F (36.2 C) (Temporal)   Ht 5\' 10"  (1.778 m)   Wt 135 lb (61.2 kg)   HC 16" (40.6 cm)   BMI 19.37 kg/m    Physical Exam Constitutional:      Appearance: Normal appearance.  Cardiovascular:     Rate and Rhythm: Normal rate.  Pulmonary:     Effort: Pulmonary effort is normal.  Skin:    General: Skin is warm and dry.  Neurological:     Mental Status: He is alert and oriented to person, place, and time.  Psychiatric:        Mood and Affect: Mood normal.        Behavior: Behavior normal.       Results for orders placed or performed in visit on 03/20/20  POCT urinalysis dipstick  Result Value Ref Range   Color, UA Dark Yellow    Clarity, UA Clear    Glucose, UA Negative Negative   Bilirubin, UA Negative    Ketones, UA 80    Spec Grav, UA 1.020 1.010 - 1.025   Blood, UA Trace    pH, UA 6.5 5.0 - 8.0   Protein, UA Negative Negative   Urobilinogen, UA 0.2 0.2 or 1.0 E.U./dL   Nitrite, UA Negative    Leukocytes, UA Small (1+) (A) Negative   Appearance     Odor       Assessment & Plan    1. Penile discharge He has been given a 500 mg shot of Rocephin in the clinic and prescribed azithromycin 1000 mg single dose to treat possible STI. A Urine STI collection has  been sent off along with a culture. He will follow up as needed. Educated on safe sex practices.  - POCT urinalysis dipstick - cefTRIAXone (ROCEPHIN) injection 500 mg - ondansetron (ZOFRAN) 4 MG tablet; Take 1 tablet (4 mg total) by mouth every 8 (eight) hours as needed for nausea or vomiting.  Dispense: 20 tablet; Refill: 0 - azithromycin (ZITHROMAX) 500 MG tablet; Take 1 tablet (500 mg total) by mouth daily.  Dispense: 2 tablet; Refill: 0 - Urine cytology ancillary only - lidocaine (PF) (XYLOCAINE) 1 % injection 1 mL  2. STI (sexually transmitted infection) He has been given a 500 mg shot of Rocephin in the clinic and prescribed azithromycin to treat possible STI. A Urine STI collection has been sent off  along with a culture. He will follow up as needed.  - cefTRIAXone (ROCEPHIN) injection 500 mg - azithromycin (ZITHROMAX) 500 MG tablet; Take 1 tablet (500 mg total) by mouth daily.  Dispense: 2 tablet; Refill: 0  3. Urinary tract infection without hematuria, site unspecified Urine culture has been sent in. Will contact with results.  - POCT urinalysis dipstick  4. Nausea Zofran has been sent in for possible nausea with the medications.  - ondansetron (ZOFRAN) 4 MG tablet; Take 1 tablet (4 mg total) by mouth every 8 (eight) hours as needed for nausea or vomiting.  Dispense: 20 tablet; Refill: 0   Return if symptoms worsen or fail to improve.      ITrinna Post, PA-C, have reviewed all documentation for this visit. The documentation on 03/20/20 for the exam, diagnosis, procedures, and orders are all accurate and complete.    Paulene Floor  [facility] [phone] (phone) [phone] (fax)  [place]

## 2020-03-20 NOTE — Addendum Note (Signed)
Addended by: Cheron Every C on: 03/20/2020 04:17 PM   Modules accepted: Orders

## 2020-03-20 NOTE — Patient Instructions (Signed)
Preventing Sexually Transmitted Infections, Adult Sexually transmitted infections (STIs) are diseases that are passed (transmitted) from person to person through bodily fluids exchanged during sex or sexual contact. Bodily fluids include saliva, semen, blood, vaginal mucus, and urine. You may have an increased risk for developing an STI if you have unprotected oral, vaginal, or anal sex. Some common STIs include:  Herpes.  Hepatitis B.  Chlamydia.  Gonorrhea.  Syphilis.  HPV (human papillomavirus).  HIV (human immunodeficiency virus), the virus that can cause AIDS (acquired immunodeficiency syndrome). How can I protect myself from sexually transmitted infections? The only way to completely prevent STIs is not to have sex of any kind (practice abstinence). This includes oral, vaginal, or anal sex. If you are sexually active, take these actions to lower your risk of getting an STI:  Have only one sex partner (be monogamous) or limit the number of sexual partners you have.  Stay up-to-date on immunizations. Certain vaccines can lower your risk of getting certain STIs, such as: ? Hepatitis A and B vaccines. You may have been vaccinated as a young child, but likely need a booster shot as a teen or young adult. ? HPV vaccine.  Use methods that prevent the exchange of body fluids between partners (barrier protection) every time you have sex. Barrier protection can be used during oral, vaginal, or anal sex. Commonly used barrier methods include: ? Male condom. ? Male condom. ? Dental dam.  Get tested regularly for STIs. Have your sexual partner get tested regularly as well.  Avoid mixing alcohol, drugs, and sex. Alcohol and drug use can affect your ability to make good decisions and can lead to risky sexual behaviors.  Ask your health care provider about taking pre-exposure prophylaxis (PrEP) to prevent HIV infection if you: ? Have a HIV-positive sexual partner. ? Have multiple sexual  partners or partners who do not know their HIV status, and do not regularly use a condom during sex. ? Use injection drugs and share needles. Birth control pills, injections, implants, and intrauterine devices (IUDs) do not protect against STIs. To prevent both STIs and pregnancy, always use a condom with another form of birth control. Some STIs, such as herpes, are spread through skin to skin contact. A condom does not protect you from getting such STIs. If you or your partner have herpes and there is an active flare with open sores, avoid all sexual contact. Why are these changes important? Taking steps to practice safe sex protects you and others. Many STIs can be cured. However, some STIs are not curable and will affect you for the rest of your life. STIs can be passed on to another person even if you do not have symptoms. What can happen if changes are not made? Certain STIs may:  Require you to take medicine for the rest of your life.  Affect your ability to have children (your fertility).  Increase your risk for developing another STI or certain serious health conditions, such as: ? Cervical cancer. ? Head and neck cancer. ? Pelvic inflammatory disease (PID) in women. ? Organ damage or damage to other parts of your body, if the infection spreads.  Be passed to a baby during childbirth. How are sexually transmitted infections treated? If you or your partner know or think that you may have an STI:  Talk with your health care provider about what can be done to treat it. Some STIs can be treated and cured with medicines.  For curable STIs, you and   your partner should avoid sex during treatment and for several days after treatment is complete.  You and your partner should both be treated at the same time, if there is any Alegandro that your partner is infected as well. If you get treatment but your partner does not, your partner can re-infect you when you resume sexual contact.  Do not  have unprotected sex. Where to find more information Learn more about sexually transmitted diseases and infections from:  Centers for Disease Control and Prevention: ? More information about specific STIs: AppraiserFraud.fi ? Find places to get sexual health counseling and treatment for free or for a low cost: gettested.StoreMirror.com.cy  U.S. Department of Health and Human Services: http://white.info/.html Summary  The only way to completely prevent STIs is not to have sex (practice abstinence), including oral, vaginal, or anal sex.  STIs can spread through saliva, semen, blood, vaginal mucus, urine, or sexual contact.  If you do have sex, limit your number of sexual partners and use a barrier protection method every time you have sex.  If you develop an STI, get treated right away and ask your partner to be treated as well. Do not resume having sex until both of you have completed treatment for the STI. This information is not intended to replace advice given to you by your health care provider. Make sure you discuss any questions you have with your health care provider. Document Revised: 04/11/2019 Document Reviewed: 11/12/2016 Elsevier Patient Education  [place]. Chlamydia, Male  Chlamydia is an STD (sexually transmitted disease). It is a bacterial infection that spreads through sexual contact (is contagious). Chlamydia can occur in different areas of the body, including the tube that moves urine from the bladder out of the body (urethra), the throat, or the rectum. This condition is not difficult to treat. However, if left untreated, chlamydia can lead to more serious health problems. What are the causes? Chlamydia is caused by the bacteria Chlamydia trachomatis. It is passed from an infected partner during sexual activity. Chlamydia can spread through contact with the genitals, mouth, or rectum. What are the  signs or symptoms? In some cases, there may not be any symptoms for this condition (asymptomatic), especially early in the infection. If symptoms develop, they may include:  Burning when urinating.  Urinating frequently.  Pain or swelling in the testicles.  Watery, mucus-like discharge from the penis.  Redness, soreness, and swelling (inflammation) of the rectum.  Bleeding or discharge from the rectum.  Abdominal pain.  Itching, burning, or redness in the eyes, or discharge from the eyes. How is this diagnosed? This condition may be diagnosed based on:  Urine tests.  Swab tests. Depending on your symptoms, your health care provider may use a cotton swab to collect discharge from your urethra or rectum to test for the bacteria. How is this treated? This condition is treated with antibiotic medicines. Follow these instructions at home: Medicines  Take over-the-counter and prescription medicines only as told by your health care provider.  Take your antibiotic medicine as told by your health care provider. Do not stop taking the antibiotic even if you start to feel better. Sexual activity  Tell sexual partners about your infection. This includes any oral, anal, or vaginal sex partners you have had within 60 days of when your symptoms started. Sexual partners should also be treated, even if they have no signs of the disease.  Do not have sex until you and your sexual partners have completed treatment and  your health care provider says it is okay. If your health care provider prescribed you a single dose treatment, wait 7 days after taking the treatment before having sex. General instructions  It is your responsibility to get your test results. Ask your health care provider, or the department performing the test, when your results will be ready.  Get plenty of rest.  Eat a healthy, well-balanced diet.  Drink enough fluids to keep your urine clear or pale yellow.  Keep all  follow-up visits as told by your health care provider. This is important. You may need to be tested for infection again 3 months after treatment. How is this prevented? The only sure way to prevent chlamydia is to avoid sexual intercourse. However, you can lower your risk by:  Using latex condoms correctly every time you have sexual intercourse.  Not having multiple sexual partners.  Asking if your sexual partner has been tested for STIs and had negative results. Contact a health care provider if:  You develop new symptoms or your symptoms do not get better after completing treatment.  You have a fever or chills.  You have pain during sexual intercourse.  You develop new joint pain or swelling near your joints.  You have pain or soreness in your testicles. Get help right away if:  Your pain gets worse and does not get better with medicine.  You have abnormal discharge.  You develop flu-like symptoms, such as night sweats, sore throat, or muscle aches. Summary  Chlamydia is an STD (sexually transmitted disease). It is a bacterial infection that spreads (is contagious) through sexual contact.  This condition is not difficult to treat, however, if left untreated, it can lead to more serious health problems.  In some cases, there may not be any symptoms for this condition (asymptomatic).  This condition is treated with antibiotic medicines.  Using latex condoms correctly every time you have sexual intercourse can help prevent chlamydia. This information is not intended to replace advice given to you by your health care provider. Make sure you discuss any questions you have with your health care provider. Document Revised: 12/01/2017 Document Reviewed: 11/02/2016 Elsevier Patient Education  2020 [employer].

## 2020-03-20 NOTE — Telephone Encounter (Signed)
Unable to reach patient to tell him the directions are to take 1 pill each day for 2 days.  No VM set up.

## 2020-03-20 NOTE — Addendum Note (Signed)
Addended by: Trey Sailors on: 03/20/2020 04:29 PM   Modules accepted: Orders

## 2020-03-22 LAB — URINE CYTOLOGY ANCILLARY ONLY
Chlamydia: POSITIVE — AB
Comment: NEGATIVE
Comment: NEGATIVE
Comment: NORMAL
Neisseria Gonorrhea: POSITIVE — AB
Trichomonas: NEGATIVE

## 2020-03-22 LAB — URINE CULTURE

## 2020-03-25 ENCOUNTER — Telehealth: Payer: Self-pay

## 2020-03-25 NOTE — Telephone Encounter (Signed)
Patient was advised of results and that positive results had to be send to health departments as well.

## 2020-03-25 NOTE — Telephone Encounter (Signed)
-----   Message from Trey Sailors, [place] sent at 03/22/2020  3:13 PM EDT ----- Can we please fill out form and submit to health department? He has been treated for these.   You are positive for both gonorrhea and chlamydia. You have been treated for both of these infections with the antibiotic injection and two antibiotic pills. Use condoms with every sexual encounter to prevent sexually transmitted infections. All of your sexual partners should be treated for STIs before resuming sexual activity. You should wait one week after treatment before having sex.   Best, Osvaldo Angst, PA-C

## 2020-03-25 NOTE — Telephone Encounter (Signed)
Called patient no answer or voicemail box set up tot leave a message. Will tired patient at a later time. Form faxed to health department as well.

## 2020-04-16 ENCOUNTER — Emergency Department: Payer: BC Managed Care – PPO

## 2020-04-16 ENCOUNTER — Other Ambulatory Visit: Payer: Self-pay

## 2020-04-16 ENCOUNTER — Emergency Department
Admission: EM | Admit: 2020-04-16 | Discharge: 2020-04-16 | Disposition: A | Discharge status: Home / Self Care | Payer: BC Managed Care – PPO | Attending: Emergency Medicine | Admitting: Emergency Medicine

## 2020-04-16 DIAGNOSIS — S61412A Laceration without foreign body of left hand, initial encounter: Secondary | ICD-10-CM | POA: Diagnosis not present

## 2020-04-16 DIAGNOSIS — Y929 Unspecified place or not applicable: Secondary | ICD-10-CM | POA: Insufficient documentation

## 2020-04-16 DIAGNOSIS — Z87891 Personal history of nicotine dependence: Secondary | ICD-10-CM | POA: Diagnosis not present

## 2020-04-16 DIAGNOSIS — Z79899 Other long term (current) drug therapy: Secondary | ICD-10-CM | POA: Insufficient documentation

## 2020-04-16 DIAGNOSIS — Y9389 Activity, other specified: Secondary | ICD-10-CM | POA: Insufficient documentation

## 2020-04-16 DIAGNOSIS — Y999 Unspecified external cause status: Secondary | ICD-10-CM | POA: Insufficient documentation

## 2020-04-16 DIAGNOSIS — W228XXA Striking against or struck by other objects, initial encounter: Secondary | ICD-10-CM | POA: Insufficient documentation

## 2020-04-16 DIAGNOSIS — F909 Attention-deficit hyperactivity disorder, unspecified type: Secondary | ICD-10-CM | POA: Insufficient documentation

## 2020-04-16 MED ORDER — CEPHALEXIN 500 MG PO CAPS: 500.0000 mg | ORAL_CAPSULE | Freq: Three times a day (TID) | ORAL | 0 refills | Status: AC

## 2020-04-16 MED ORDER — LIDOCAINE HCL (PF) 1 % IJ SOLN
INTRAMUSCULAR | Status: AC
  Administered 2020-04-16: 5 mL
  Filled 2020-04-16: qty 5

## 2020-04-16 MED ORDER — LIDOCAINE HCL 1 % IJ SOLN: 5.0000 mL | Freq: Once | INTRAMUSCULAR | Status: AC
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
## 2020-04-16 NOTE — Discharge Instructions (Signed)
Have sutures removed in 7 days. Take Keflex 3 times daily for the next 7 days.

## 2020-04-26 ENCOUNTER — Ambulatory Visit: Payer: BC Managed Care – PPO | Admitting: Family Medicine

## 2020-04-26 NOTE — Progress Notes (Deleted)
     Established patient visit   Patient: Dennis Edwards   DOB: 02-Feb-1996   23 y.o. Male  MRN: [id number] Visit Date: 04/26/2020  Today's healthcare provider: Mila Merry, MD   No chief complaint on file.  Subjective    HPI Follow up ER visit  Patient was seen in ER for laceration of left hand on 04/16/2020. He was treated for laceration of left hand. Treatment for this included started Keflex. He reports {DESC; EXCELLENT/GOOD/FAIR:19992} compliance with treatment. He reports this condition is {improved/worse/unchanged:3041574}.  -----------------------------------------------------------------------------------------    {Show patient history (optional):23778::" "}   Medications: Outpatient Medications Prior to Visit  Medication Sig  . amphetamine-dextroamphetamine (ADDERALL) 10 MG tablet Take by mouth.  Marland Kitchen azithromycin (ZITHROMAX) 500 MG tablet Take 1 tablet (500 mg total) by mouth daily.  . ondansetron (ZOFRAN) 4 MG tablet Take 1 tablet (4 mg total) by mouth every 8 (eight) hours as needed for nausea or vomiting.   No facility-administered medications prior to visit.    Review of Systems  {Heme  Chem  Endocrine  Serology  Results Review (optional):23779::" "}  Objective    There were no vitals taken for this visit. {Show previous vital signs (optional):23777::" "}  Physical Exam  ***  No results found for any visits on 04/26/20.  Assessment & Plan     ***  No follow-ups on file.      {provider attestation***:1}   Mila Merry, MD  [facility] [phone] (phone) [phone] (fax)  [facility]

## 2020-05-02 ENCOUNTER — Encounter: Payer: Self-pay | Admitting: Family Medicine

## 2020-05-02 ENCOUNTER — Ambulatory Visit (INDEPENDENT_AMBULATORY_CARE_PROVIDER_SITE_OTHER): Payer: BC Managed Care – PPO | Admitting: Family Medicine

## 2020-05-02 ENCOUNTER — Other Ambulatory Visit: Payer: Self-pay

## 2020-05-02 VITALS — BP 110/76 | HR 66 | Temp 97.1°F | Wt 135.0 lb

## 2020-05-02 DIAGNOSIS — Z8619 Personal history of other infectious and parasitic diseases: Secondary | ICD-10-CM | POA: Diagnosis not present

## 2020-05-02 DIAGNOSIS — R369 Urethral discharge, unspecified: Secondary | ICD-10-CM

## 2020-05-02 MED ORDER — DOXYCYCLINE HYCLATE 100 MG PO TABS: 100.0000 mg | ORAL_TABLET | Freq: Two times a day (BID) | ORAL | 0 refills | Status: DC

## 2020-05-02 MED ORDER — CEFIXIME 400 MG PO CAPS: 800.0000 mg | ORAL_CAPSULE | Freq: Every day | ORAL | 0 refills | Status: DC

## 2020-05-02 NOTE — Progress Notes (Signed)
Acute Office Visit  Subjective:    Patient ID: Dennis Edwards, male    DOB: March 24, 1996, 24 y.o.   MRN: [id number]  Chief Complaint  Patient presents with  . Exposure to STD    HPI Patient is in today for evaluation and testing for possible sexually transmitted diseases.   Patient was tested and treated for [facility] and Chlamydia in April. He was instructed to have his partner treated before resuming intercourse. The patient reports that the girl he was seeing at the time of his treatment was told to get checked. The patient reports that about 5 days after his treatment here he went back to seeing his old girlfriend.  His symptoms had gotten better and he thought he was cleared.  Then about 2 weeks later he began having the same symptoms again, penile pain and discharge.  The patient states the only symptom the current girlfriend complained of at this time was an increase in frequency  Of urination.  She is being seen by someone today for evaluation.  No past medical history on file.  Past Surgical History:  Procedure Laterality Date  . thumb surgery      No family history on file.  Social History   Tobacco Use  . Smoking status: Former Games developer  . Smokeless tobacco: Never Used  Substance Use Topics  . Alcohol use: No  . Drug use: No    Outpatient Medications Prior to Visit  Medication Sig Dispense Refill  . amphetamine-dextroamphetamine (ADDERALL) 10 MG tablet Take by mouth.    Marland Kitchen azithromycin (ZITHROMAX) 500 MG tablet Take 1 tablet (500 mg total) by mouth daily. (Patient not taking: Reported on 05/02/2020) 2 tablet 0  . ondansetron (ZOFRAN) 4 MG tablet Take 1 tablet (4 mg total) by mouth every 8 (eight) hours as needed for nausea or vomiting. (Patient not taking: Reported on 05/02/2020) 20 tablet 0   No facility-administered medications prior to visit.   No Known Allergies  Review of Systems  Constitutional: Negative for fever.  Genitourinary: Positive for discharge, dysuria  and penile pain. Negative for difficulty urinating, enuresis, flank pain, frequency, penile swelling, scrotal swelling, testicular pain and urgency.      Objective:     Vitals:   05/02/20 1427  BP: 110/76  Pulse: 66  Temp: (!) 97.1 F (36.2 C)  SpO2: 99%    Physical Exam Constitutional:      General: He is not in acute distress.    Appearance: He is well-developed.  HENT:     Head: Normocephalic and atraumatic.     Right Ear: Hearing normal.     Left Ear: Hearing normal.     Nose: Nose normal.  Eyes:     General: Lids are normal. No scleral icterus.       Right eye: No discharge.        Left eye: No discharge.     Conjunctiva/sclera: Conjunctivae normal.  Pulmonary:     Effort: Pulmonary effort is normal. No respiratory distress.  Genitourinary:    Testes: Normal.     Comments: Yellow urethral discharge with tenderness in the urethra. No local lymphadenopathy. No epididymal tenderness or testicular tenderness. Musculoskeletal:        General: No swelling or tenderness. Normal range of motion.  Skin:    Findings: No lesion or rash.  Neurological:     Mental Status: He is alert and oriented to person, place, and time.  Psychiatric:  Speech: Speech normal.        Behavior: Behavior normal.        Thought Content: Thought content normal.     BP 110/76 (BP Location: Right Arm, Patient Position: Sitting, Cuff Size: Normal)   Pulse 66   Temp (!) 97.1 F (36.2 C) (Skin)   Wt 135 lb (61.2 kg)   SpO2 99%   BMI 18.31 kg/m  Wt Readings from Last 3 Encounters:  05/02/20 135 lb (61.2 kg)  04/16/20 145 lb (65.8 kg)  03/20/20 135 lb (61.2 kg)    Health Maintenance Due  Topic Date Due  . COVID-19 Vaccine (1) Never done  . HIV Screening  Never done  . TETANUS/TDAP  Never done      Assessment & Plan:   1. Urethral discharge in male Onset of urethral discharge and penile discomfort with burning the past several days. Has had sexual intercourse with a new  partner a couple weeks ago. Was treated with Rocephin and Azithromycin on 03-20-20. Suspect re-infection and will treat with Suprax and Doxycycline. Re-culture today. Recheck in a week to culture-for-cure. - cefixime (SUPRAX) 400 MG CAPS capsule; Take 2 capsules (800 mg total) by mouth daily.  Dispense: 2 capsule; Refill: 0 - doxycycline (VIBRA-TABS) 100 MG tablet; Take 1 tablet (100 mg total) by mouth 2 (two) times daily.  Dispense: 14 tablet; Refill: 0  2. History of gonorrhea Positive culture on 03-20-20. Gives history of recurrence after another sexual encounter. Recheck culture today and treat with Suprax and Doxycycline. Follow up in a week to culture for cure. Must be abstinent until cleared by next culture. - cefixime (SUPRAX) 400 MG CAPS capsule; Take 2 capsules (800 mg total) by mouth daily.  Dispense: 2 capsule; Refill: 0 - doxycycline (VIBRA-TABS) 100 MG tablet; Take 1 tablet (100 mg total) by mouth 2 (two) times daily.  Dispense: 14 tablet; Refill: 0  3. History of chlamydia infection Positive test results on 03-20-20 and was treated with Rocephin and Azithromycin. Probably re-infected. Will change to Suprax and Doxycycline the re-culture for cure in a week.  - cefixime (SUPRAX) 400 MG CAPS capsule; Take 2 capsules (800 mg total) by mouth daily.  Dispense: 2 capsule; Refill: 0 - doxycycline (VIBRA-TABS) 100 MG tablet; Take 1 tablet (100 mg total) by mouth 2 (two) times daily.  Dispense: 14 tablet; Refill: 0  No orders of the defined types were placed in this encounter.  Juluis Mire, [place], Vernie Murders, [place], have reviewed all documentation for this visit. The documentation on 05/02/20 for the exam, diagnosis, procedures, and orders are all accurate and complete.

## 2020-05-04 LAB — SPECIMEN STATUS REPORT

## 2020-05-04 LAB — GC/CHLAMYDIA PROBE AMP
Chlamydia trachomatis, NAA: POSITIVE — AB
Neisseria Gonorrhoeae by PCR: POSITIVE — AB

## 2020-05-10 ENCOUNTER — Other Ambulatory Visit: Payer: Self-pay

## 2020-05-10 ENCOUNTER — Ambulatory Visit (INDEPENDENT_AMBULATORY_CARE_PROVIDER_SITE_OTHER): Payer: BC Managed Care – PPO | Admitting: Family Medicine

## 2020-05-10 ENCOUNTER — Encounter: Payer: Self-pay | Admitting: Family Medicine

## 2020-05-10 ENCOUNTER — Other Ambulatory Visit (HOSPITAL_COMMUNITY)
Admission: RE | Admit: 2020-05-10 | Discharge: 2020-05-10 | Disposition: A | Discharge status: Home / Self Care | Payer: BC Managed Care – PPO | Source: Ambulatory Visit | Attending: Family Medicine | Admitting: Family Medicine

## 2020-05-10 VITALS — BP 111/74 | HR 98 | Temp 96.2°F | Wt 133.0 lb

## 2020-05-10 DIAGNOSIS — Z8619 Personal history of other infectious and parasitic diseases: Secondary | ICD-10-CM | POA: Insufficient documentation

## 2020-05-10 NOTE — Progress Notes (Signed)
Established patient visit   Patient: Dennis Edwards   DOB: 1996/05/16   23 y.o. Male  MRN: [id number] Visit Date: 05/10/2020  Today's healthcare provider: Vernie Murders, PA   Chief Complaint  Patient presents with  . SEXUALLY TRANSMITTED DISEASE   Mertie Moores as a Education administrator for [employer], [employer].,have documented all relevant documentation on the behalf of [employer],as directed by  [employer] while in the presence of [employer], [place].  Subjective    HPI Follow up for Gonorrhea & Chlamydia:   The patient was last seen for this 8 days ago. Changes made at last visit include started Doxycycline and Cefixime.  He reports good compliance with treatment. He feels that condition is Improved. He is not having side effects.   ----------------------------------------------------------------------------------------- Patient Active Problem List   Diagnosis Date Noted  . ADD (attention deficit disorder) 03/20/2020   Past Surgical History:  Procedure Laterality Date  . thumb surgery     Social History   Tobacco Use  . Smoking status: Former Research scientist (life sciences)  . Smokeless tobacco: Never Used  Substance Use Topics  . Alcohol use: No  . Drug use: No   No Known Allergies     Medications: Outpatient Medications Prior to Visit  Medication Sig  . amphetamine-dextroamphetamine (ADDERALL) 10 MG tablet Take by mouth.  . cefixime (SUPRAX) 400 MG CAPS capsule Take 2 capsules (800 mg total) by mouth daily.  Marland Kitchen doxycycline (VIBRA-TABS) 100 MG tablet Take 1 tablet (100 mg total) by mouth 2 (two) times daily.  . [DISCONTINUED] azithromycin (ZITHROMAX) 500 MG tablet Take 1 tablet (500 mg total) by mouth daily. (Patient not taking: Reported on 05/02/2020)  . [DISCONTINUED] ondansetron (ZOFRAN) 4 MG tablet Take 1 tablet (4 mg total) by mouth every 8 (eight) hours as needed for nausea or vomiting. (Patient not taking: Reported on 05/02/2020)   No facility-administered  medications prior to visit.    Review of Systems  Constitutional: Negative.   Respiratory: Negative.   Cardiovascular: Negative.   Genitourinary: Negative.   Musculoskeletal: Negative.     Objective    BP 111/74 (BP Location: Right Arm, Patient Position: Sitting, Cuff Size: Normal)   Pulse 98   Temp (!) 96.2 F (35.7 C) (Temporal)   Wt 133 lb (60.3 kg)   BMI 18.04 kg/m   Physical Exam Genitourinary:    Penis: Normal.      Testes: Normal.     Comments: No local lymphadenopathy or urethral discharge.   General: Appearance:    Thin male in no acute distress  Eyes:    PERRL, conjunctiva/corneas clear, EOM's intact       Lungs:     Clear to auscultation bilaterally, respirations unlabored  Heart:    Normal heart rate. Normal rhythm. No murmurs, rubs, or gallops.   MS:   All extremities are intact.   Neurologic:   Awake, alert, oriented x 3. No apparent focal neurological           defect.       No results found for any visits on 05/10/20.  Assessment & Plan    1. History of gonorrhea Has had positive test results on 03-20-20 for GC and Chlamydia. Was treated with Rocephin and Azithromycin. Had intercourse with a new partner and symptoms returned (urethral pain and discharge) and tested positive for both again. Changed to Suprax with Doxycycline and symptoms have abated again. Will recheck for cure and get RPR with HIV antibody.  Positive test results have been shared with the health department. States partner had been treated at the same time this time. Recommend he use condoms to prevent disease spread.  - Urine cytology ancillary only - RPR - HIV Antibody (routine testing w rflx)  2. History of chlamydia infection Has had multiple positive tests for Chlamydia since August 2020 with adequate treatment each time with Z-pak and Rocephin. With another positive test on 05-02-20, changed to Suprax and Doxycycline. Symptoms have cleared. Recheck urine test for cure and check RPR with  HIV test. - Urine cytology ancillary only - RPR - HIV Antibody (routine testing w rflx)  Return if symptoms worsen or fail to improve.         Dortha Kern, PA  [facility] 352 [phone] (phone) [phone] (fax)  [facility]

## 2020-05-13 ENCOUNTER — Telehealth: Payer: Self-pay | Admitting: Family Medicine

## 2020-05-13 NOTE — Telephone Encounter (Signed)
Please advise 

## 2020-05-13 NOTE — Telephone Encounter (Signed)
Copied from CRM [id number]. Topic: General - Other >> May 13, 2020 11:26 AM Tamela Oddi wrote: Reason for CRM: Patient called to check on his urine test results.  Please advise and call when available.  CB# [phone]

## 2020-05-14 ENCOUNTER — Telehealth: Payer: Self-pay

## 2020-05-14 LAB — URINE CYTOLOGY ANCILLARY ONLY
Chlamydia: NEGATIVE
Comment: NEGATIVE
Comment: NEGATIVE
Comment: NORMAL
Neisseria Gonorrhea: NEGATIVE
Trichomonas: NEGATIVE

## 2020-05-14 NOTE — Telephone Encounter (Signed)
Patient advised as below. Patient reports he has not had blood drawn. Will come in any day in the next few weeks.

## 2020-05-14 NOTE — Telephone Encounter (Signed)
-----   Message from Tamsen Roers, [place] sent at 05/14/2020 12:25 PM EDT ----- Urine test negative for any residual infection. Awaiting final report of blood tests.

## 2020-05-14 NOTE — Telephone Encounter (Signed)
Copied from CRM [id number]. Topic: General - Inquiry >> May 14, 2020 11:27 AM Floria Raveling A wrote: Reason for CRM: pt called in and stated that he would like someone to give a call when the 6/11 results come back in.  He was just following up today  Best number [phone]
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
## 2021-06-18 ENCOUNTER — Ambulatory Visit: Payer: Self-pay

## 2021-06-18 NOTE — Telephone Encounter (Signed)
Pt

## 2021-06-18 NOTE — Telephone Encounter (Signed)
Pt.'s mom called to schedule appointment for today if possible for anxiety. States he recently had a baby "and has a lot on him." Decreased appetite, anxiety while driving. Called pt. And he verifies these symptoms.No thoughts of harming himself or others. Pt. Is off work and would like to be seen today. No availability in practice today. Please advise.

## 2021-06-18 NOTE — Telephone Encounter (Signed)
Pt. Reports he is agreeable to being seen at [facility]. Please call pt.'s new mobile number [phone].

## 2021-06-18 NOTE — Telephone Encounter (Signed)
Reason for Disposition . [1] Symptoms of anxiety or panic AND [2] has not been evaluated for this by physician  Answer Assessment - Initial Assessment Questions 1. CONCERN: "Did anything happen that prompted you to call today?"      Anxiety 2. ANXIETY SYMPTOMS: "Can you describe how you (your loved one; patient) have been feeling?" (e.g., tense, restless, panicky, anxious, keyed up, overwhelmed, sense of impending doom).      Anxious 3. ONSET: "How long have you been feeling this way?" (e.g., hours, days, weeks)     Sunday 4. SEVERITY: "How would you rate the level of anxiety?" (e.g., 0 - 10; or mild, moderate, severe).     Moderate 5. FUNCTIONAL IMPAIRMENT: "How have these feelings affected your ability to do daily activities?" "Have you had more difficulty than usual doing your normal daily activities?" (e.g., getting better, same, worse; self-care, school, work, interactions)     Yes 6. HISTORY: "Have you felt this way before?" "Have you ever been diagnosed with an anxiety problem in the past?" (e.g., generalized anxiety disorder, panic attacks, PTSD). If Yes, ask: "How was this problem treated?" (e.g., medicines, counseling, etc.)     No 7. RISK OF HARM - SUICIDAL IDEATION: "Do you ever have thoughts of hurting or killing yourself?" If Yes, ask:  "Do you have these feelings now?" "Do you have a plan on how you would do this?"     No 8. TREATMENT:  "What has been done so far to treat this anxiety?" (e.g., medicines, relaxation strategies). "What has helped?"     None 9. TREATMENT - THERAPIST: "Do you have a counselor or therapist? Name?"     No 10. POTENTIAL TRIGGERS: "Do you drink caffeinated beverages (e.g., coffee, colas, teas), and how much daily?" "Do you drink alcohol or use any drugs?" "Have you started any new medicines recently?"     No 10. PATIENT SUPPORT: "Who is with you now?" "Who do you live with?" "Do you have family or friends who you can talk to?"        Lives with  family 48. OTHER SYMPTOMS: "Do you have any other symptoms?" (e.g., feeling depressed, trouble concentrating, trouble sleeping, trouble breathing, palpitations or fast heartbeat, chest pain, sweating, nausea, or diarrhea)       Decreased appetite 12. PREGNANCY: "Is there any [place] you are pregnant?" "When was your last menstrual period?"       N/a  Protocols used: Anxiety and Panic Attack-A-AH

## 2021-06-18 NOTE — Telephone Encounter (Signed)
Spoke with patient on phone and contacted [facility] to schedule appointment, patient has appointment for evaluation tomorrow at 1PM. Dennis Edwards

## 2021-06-18 NOTE — Telephone Encounter (Signed)
Attempted to contact patients mother 3x on home number provided and not a working number. Attempted to contact home number listed under patient  and number states that it is unable to process call. If parent/patient contacts office back we do not have openings today or tomorrow in office, if parent is in agreeance I can contact [facility] to schedule patient appointment for evaluation. KW

## 2021-06-19 ENCOUNTER — Ambulatory Visit (INDEPENDENT_AMBULATORY_CARE_PROVIDER_SITE_OTHER): Payer: BC Managed Care – PPO | Admitting: Nurse Practitioner

## 2021-06-19 ENCOUNTER — Other Ambulatory Visit: Payer: Self-pay

## 2021-06-19 ENCOUNTER — Encounter: Payer: Self-pay | Admitting: Nurse Practitioner

## 2021-06-19 VITALS — BP 89/54 | HR 58 | Temp 98.7°F | Wt 128.2 lb

## 2021-06-19 DIAGNOSIS — F988 Other specified behavioral and emotional disorders with onset usually occurring in childhood and adolescence: Secondary | ICD-10-CM

## 2021-06-19 DIAGNOSIS — F418 Other specified anxiety disorders: Secondary | ICD-10-CM

## 2021-06-19 DIAGNOSIS — Z202 Contact with and (suspected) exposure to infections with a predominantly sexual mode of transmission: Secondary | ICD-10-CM | POA: Insufficient documentation

## 2021-06-19 MED ORDER — CEFTRIAXONE SODIUM 500 MG IJ SOLR
500.0000 mg | Freq: Once | INTRAMUSCULAR | Status: AC
  Administered 2021-06-19: 500 mg via INTRAMUSCULAR

## 2021-06-19 MED ORDER — DOXYCYCLINE HYCLATE 100 MG PO TABS
100.0000 mg | ORAL_TABLET | Freq: Two times a day (BID) | ORAL | 0 refills | Status: AC
Start: 2021-06-19 — End: 2021-06-26

## 2021-06-19 MED ORDER — HYDROXYZINE PAMOATE 25 MG PO CAPS: 25.0000 mg | ORAL_CAPSULE | Freq: Three times a day (TID) | ORAL | 0 refills | Status: DC | PRN

## 2021-06-19 MED ORDER — CITALOPRAM HYDROBROMIDE 10 MG PO TABS: 10.0000 mg | ORAL_TABLET | Freq: Every day | ORAL | 3 refills | Status: DC
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
## 2021-06-19 NOTE — Patient Instructions (Signed)
Managing Post-Traumatic Stress Disorder If you have been diagnosed with post-traumatic stress disorder (PTSD), you may be relieved that you now know why you have felt or behaved a certain way. Still, you may feel overwhelmed about the treatment ahead. You may also wonderhow to get the support you need and how to deal with the condition day-to-day. If you are living with PTSD, there are ways to help you recover from it andmanage your symptoms. How to manage lifestyle changes Managing stress Stress is your body's reaction to life changes and events, both good and bad. Stress can make PTSD worse. Take the following steps to manage stress: Talk with your health care provider or a counselor if you would like to learn more about techniques to reduce your stress. He or she may suggest some stress reduction techniques such as: Muscle relaxation exercises. Regular exercise. Meditation, yoga, or other mind-body exercises. Breathing exercises. Listening to quiet music. Spending time outside. Maintain a healthy lifestyle. Eat a healthy diet, exercise regularly, get plenty of sleep, and take time to relax. Spend time with others. Talk with them about how you are feeling and what kind of support you need. Try not to isolate yourself, even though you may feel like doing that. Isolating yourself can delay your recovery. Do activities and hobbies that you enjoy. Pace yourself when doing stressful things. Take breaks, and reward yourself when you finish. Make sure that you do not overload your schedule.  Medicines Your health care provider may suggest certain medicines if he or she feels that they will help to improve your condition. Medicines for depression (antidepressants) or severe loss of contact with reality (antipsychotics) may be used to treat PTSD. Avoid using alcohol and other substances that may prevent your medicines from working properly. It is also important to: Talk with your pharmacist or health  care provider about all medicines that you take, their possible side effects, and which medicines are safe to take together. Make it your goal to take part in all treatment decisions (shared decision-making). Ask about possible side effects of medicines that your health care provider recommends, and tell him or her how you feel about having those side effects. It is best if shared decision-making with your health care provider is part of your total treatment plan. If your health care provider prescribes a medicine, you may not notice the full benefits of it for 4-8 weeks. Most people who are treated for PTSD need to takemedicine for at least 6-12 months before they feel better. If you are taking medicines as part of your treatment, do not stop taking medicines before you ask your health care provider if it is safe to stop. You may need to have the medicine slowly decreased (tapered) over time to lower the risk of harmful side effects. Relationships Many people who have PTSD have difficulty trusting others. Make an effort to: Take risks and develop trust with close friends and family members. Developing trust in others can help you feel safe and connect you with emotional support. Be open and honest about your feelings. Have fun and relax in safe spaces, such as with friends and family. Think about going to couples counseling, family education classes, or family therapy. Your loved ones may not always know how to be supportive. Therapy can be helpful for everyone. How to recognize changes in your condition Be aware of your symptoms and how often you have them. The following symptoms mean that you need to seek help for your PTSD:   You feel suspicious and angry. You have repeated flashbacks. You avoid going out or being with others. You have an increasing number of fights with close friends or family members, such as your spouse. You have thoughts about hurting yourself or others. You cannot get relief  from feelings of depression or anxiety. Follow these instructions at home: Lifestyle Exercise regularly. Try to do 30 or more minutes of physical activity on most days of the week. Try to get 7-9 hours of sleep each night. To help with sleep: Keep your bedroom cool and dark. Avoid screen time before bedtime. This means avoiding use of your TV, computer, tablet, and cell phone. Practice self-soothing skills and use them daily. Try to have fun and seek humor in your life. Eating and drinking Do not eat a heavy meal during the hour before you go to bed. Do not drink alcohol or caffeinated drinks before bed. Avoid using alcohol or drugs. General instructions If your PTSD is affecting your marriage or family, seek help from a family therapist. Remind yourself that recovering from the trauma is a process and takes time. Take over-the-counter and prescription medicines only as told by your health care provider. Make sure to let all of your health care providers know that you have PTSD. This is especially important if you are having surgery or need to be admitted to the hospital. Keep all follow-up visits as told by your health care providers. This is important. Where to find support Talking to others Explain that PTSD is a mental health problem. It is something that a person can develop after experiencing or seeing a life-threatening event. Tell them that PTSD makes you feel stress like you did during the event. Talk to your loved ones about the symptoms you have. Also tell them what things or situations can cause symptoms to start (are triggers for you). Assure your loved ones that there are treatments to help PTSD. Discuss possibly seeking family therapy or couples therapy. If you are worried or fearful about seeking treatment, ask for support. Keep daily contact with at least one trusted friend or family member. Finances Not all insurance plans cover mental health care, so it is important to  check with your insurance carrier. If paying for co-pays or counseling services is a problem, search for a local or county mental health care center. Public mental health care services may be offered there at a low cost or no cost when you are not able to see a private health care provider. If you are a veteran, contact alocal veterans organization or veterans hospital for more information. If you are taking medicine for PTSD, you may be able to get the genericform, which may be less expensive than brand-name medicine. Some makers of prescription medicines also offer help to patients who cannot afford themedicines that they need. Therapy and support groups Find a support group in your community. Often, groups are available for military veterans, trauma victims, and family members or caregivers. Look into volunteer opportunities. Taking part in these can help you feel more connected to your community. Contact a local organization to find out if you are eligible for a service dog. Where to find more information Go to this website to find more information about PTSD, treatment of PTSD, and how to get support: National Center for PTSD: www.ptsd.va.gov Contact a health care provider if: Your symptoms get worse or do not get better. Get help right away if: You have thoughts about hurting yourself or others. If   you ever feel like you may hurt yourself or others, or have thoughts about taking your own life, get help right away. You can go to your nearest emergency department or call: Your local emergency services (911 in the [place] A suicide crisis helpline, such as the National Suicide Prevention Lifeline at 1-800-273-8255. This is open 24-hours a day. Summary If you are living with PTSD, there are ways to help you recover from it and manage your symptoms. Find supportive environments and people who understand PTSD. Spend time in those places, and maintain contact with those people. Work with your health  care team to create a plan for managing PTSD. The plan should include counseling, stress reduction techniques, and healthy lifestyle habits. This information is not intended to replace advice given to you by your health care provider. Make sure you discuss any questions you have with your healthcare provider. Document Revised: 08/02/2020 Document Reviewed: 08/02/2020 Elsevier Patient Education  2022 [employer] Inc.  

## 2021-06-19 NOTE — Assessment & Plan Note (Signed)
No current medications, may benefit psychiatry referral in future for further assessment.  ?more anxiety element involved.

## 2021-06-19 NOTE — Assessment & Plan Note (Signed)
From girlfriend -- will treat with Rocephin 500 MG IM in office and obtain urine testing.  He decline blood STD testing today, recommended this.  Follow-up with PCP for retesting if positive.
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
## 2021-06-19 NOTE — Assessment & Plan Note (Signed)
From girlfriend -- will treat with Doxycycline 100 MG BID x 7 days and obtain urine testing.  He decline blood STD testing today, recommended this.  Follow-up with PCP for retesting if positive.

## 2021-06-22 LAB — GC/CHLAMYDIA PROBE AMP
Chlamydia trachomatis, NAA: NEGATIVE
Neisseria Gonorrhoeae by PCR: NEGATIVE

## 2021-06-23 NOTE — Progress Notes (Signed)
Contacted via MyChart   Good news Dennis Edwards, although your girlfriend was positive for gonorrhea or chlamydia, your testing returned negative. You can continue current treatment until complete to be on safe side due to exposure. Keep being awesome!!  Thank you for allowing me to participate in your care.  I appreciate you. Kindest regards, Tajuana Kniskern

## 2021-10-29 ENCOUNTER — Ambulatory Visit: Payer: Self-pay | Admitting: *Deleted

## 2021-10-29 NOTE — Telephone Encounter (Signed)
FYI

## 2021-10-29 NOTE — Telephone Encounter (Signed)
Per agent: "The patient has experienced cold and flu like symptoms since Monday 10/27/21   The patient would like to be prescribed something to help with their sweating, sore throat, vomiting and diarrhea   The patient would like to be prescribed something to help with their symptoms   The patient's mother would like to speak with a member of staff further "  Pt evasive historian. Reports frontal headache, sweating, body aches, cough. Reports nausea, had been vomiting, non etoday. HAd diarrhea, none presently "Because I can't eat anything." States is coughing up blood. "Just pure blood." Throat no longer sore, no longer has diarrhea. States took Theraflu,"Helped."   Asking for something to be called in for "Sweating." Denies chills. Advised UC. States will follow disposition.    Reason for Disposition  Coughing up rusty-colored (reddish-brown) sputum  Answer Assessment - Initial Assessment Questions 1. ONSET: "When did the cough begin?"      Monday 2. SEVERITY: "How bad is the cough today?"      Getting better 3. SPUTUM: "Describe the color of your sputum" (none, dry cough; clear, white, yellow, green)     Bloody 4. HEMOPTYSIS: "Are you coughing up any blood?" If so ask: "How much?" (flecks, streaks, tablespoons, etc.)     Yes, "Just blood" 5. DIFFICULTY BREATHING: "Are you having difficulty breathing?" If Yes, ask: "How bad is it?" (e.g., mild, moderate, severe)    - MILD: No SOB at rest, mild SOB with walking, speaks normally in sentences, can lie down, no retractions, pulse < 100.    - MODERATE: SOB at rest, SOB with minimal exertion and prefers to sit, cannot lie down flat, speaks in phrases, mild retractions, audible wheezing, pulse 100-120.    - SEVERE: Very SOB at rest, speaks in single words, struggling to breathe, sitting hunched forward, retractions, pulse > 120      No 6. FEVER: "Do you have a fever?" If Yes, ask: "What is your temperature, how was it measured, and when did  it start?"     Subjective 7. CARDIAC HISTORY: "Do you have any history of heart disease?" (e.g., heart attack, congestive heart failure)      *No Answer* 8. LUNG HISTORY: "Do you have any history of lung disease?"  (e.g., pulmonary embolus, asthma, emphysema)     *No Answer* 9. PE RISK FACTORS: "Do you have a history of blood clots?" (or: recent major surgery, recent prolonged travel, bedridden)     *No Answer* 10. OTHER SYMPTOMS: "Do you have any other symptoms?" (e.g., runny nose, wheezing, chest pain)       Frontal headache, body aches, nausea  Protocols used: Cough - Acute Productive-A-AH

## 2022-04-14 ENCOUNTER — Encounter (HOSPITAL_COMMUNITY): Payer: Self-pay

## 2022-04-14 ENCOUNTER — Other Ambulatory Visit: Payer: Self-pay

## 2022-04-14 ENCOUNTER — Emergency Department (HOSPITAL_COMMUNITY)
Admission: EM | Admit: 2022-04-14 | Discharge: 2022-04-14 | Disposition: A | Discharge status: Home / Self Care | Payer: 59 | Attending: Emergency Medicine | Admitting: Emergency Medicine

## 2022-04-14 DIAGNOSIS — R369 Urethral discharge, unspecified: Secondary | ICD-10-CM | POA: Diagnosis not present

## 2022-04-14 DIAGNOSIS — Z202 Contact with and (suspected) exposure to infections with a predominantly sexual mode of transmission: Secondary | ICD-10-CM | POA: Diagnosis not present

## 2022-04-14 DIAGNOSIS — R69 Illness, unspecified: Secondary | ICD-10-CM | POA: Diagnosis not present

## 2022-04-14 MED ORDER — CEFTRIAXONE SODIUM 1 G IJ SOLR
500.0000 mg | Freq: Once | INTRAMUSCULAR | Status: AC
  Administered 2022-04-14: 500 mg via INTRAMUSCULAR
  Filled 2022-04-14: qty 10

## 2022-04-14 MED ORDER — STERILE WATER FOR INJECTION IJ SOLN
INTRAMUSCULAR | Status: AC
  Administered 2022-04-14: 10 mL
  Filled 2022-04-14: qty 10

## 2022-04-14 MED ORDER — METRONIDAZOLE 500 MG PO TABS
2000.0000 mg | ORAL_TABLET | Freq: Once | ORAL | Status: AC
  Administered 2022-04-14: 2000 mg via ORAL
  Filled 2022-04-14: qty 4

## 2022-04-14 MED ORDER — DOXYCYCLINE HYCLATE 100 MG PO CAPS: 100.0000 mg | ORAL_CAPSULE | Freq: Two times a day (BID) | ORAL | 0 refills | Status: DC

## 2022-04-14 NOTE — ED Provider Notes (Signed)
?[facility]-EMERGENCY DEPT ?Provider Note ? ? ?CSN: [id number] ?Arrival date & time: 04/14/22  9024 ? ?  ? ?History ? ?Chief Complaint  ?Patient presents with  ? Exposure to STD  ? ? ?Elier L Gavigan is a 26 y.o. male with no relevant or provided medical history.  The patient presents ED for evaluation of suspected STI.  Patient reports that the partner who he is currently in a relationship with was recently diagnosed with gonorrhea, chlamydia, trichomonas.  Patient reports that 3 days ago he began experiencing symptoms to include yellow-colored discharge from his penis, burning on urination.  Patient believes that his partner has been unfaithful.  Patient denies any testicular pain, scrotal pain, penile pain, fevers, body aches or chills, nausea or vomiting, genital sores. ? ? ?Exposure to STD ? ? ?  ? ?Home Medications ?Prior to Admission medications   ?Medication Sig Start Date End Date Taking? Authorizing Provider  ?doxycycline (VIBRAMYCIN) 100 MG capsule Take 1 capsule (100 mg total) by mouth 2 (two) times daily. 04/14/22  Yes Al Decant, PA-C  ?citalopram (CELEXA) 10 MG tablet Take 1 tablet (10 mg total) by mouth daily. 06/19/21   Aura Dials T, NP  ?hydrOXYzine (VISTARIL) 25 MG capsule Take 1 capsule (25 mg total) by mouth every 8 (eight) hours as needed. 06/19/21   Marjie Skiff, NP  ?   ? ?Allergies    ?Patient has no known allergies.   ? ?Review of Systems   ?Review of Systems  ?Constitutional:  Negative for chills and fever.  ?Genitourinary:  Positive for dysuria and penile discharge. Negative for genital sores, penile pain, scrotal swelling and testicular pain.  ?All other systems reviewed and are negative. ? ?Physical Exam ?Updated Vital Signs ?BP (!) 133/95 (BP Location: Left Arm)   Pulse 69   Temp 98.1 ?F (36.7 ?C) (Oral)   Resp 18   Ht 6\' 1"  (1.854 m)   Wt 61.2 kg   SpO2 100%   BMI 17.81 kg/m?  ?Physical Exam ?Vitals and nursing note reviewed. Exam conducted  with a chaperone present.  ?Constitutional:   ?   General: He is not in acute distress. ?   Appearance: Normal appearance. He is not ill-appearing, toxic-appearing or diaphoretic.  ?HENT:  ?   Head: Normocephalic and atraumatic.  ?   Nose: Nose normal. No congestion.  ?   Mouth/Throat:  ?   Mouth: Mucous membranes are moist.  ?   Pharynx: Oropharynx is clear.  ?Eyes:  ?   Extraocular Movements: Extraocular movements intact.  ?   Conjunctiva/sclera: Conjunctivae normal.  ?   Pupils: Pupils are equal, round, and reactive to light.  ?Cardiovascular:  ?   Rate and Rhythm: Normal rate and regular rhythm.  ?Pulmonary:  ?   Effort: Pulmonary effort is normal.  ?   Breath sounds: Normal breath sounds. No wheezing.  ?Abdominal:  ?   General: Abdomen is flat. Bowel sounds are normal.  ?   Palpations: Abdomen is soft.  ?   Tenderness: There is no abdominal tenderness.  ?Genitourinary: ?   Pubic Area: No rash.   ?   Penis: Normal and circumcised. No tenderness, discharge or swelling.   ?   Tanner stage (genital): 5.  ?   Comments: Chaperone present.  No appreciable discharge noted on inspection.  Patient testicles nontender to palpation.  Cremasteric reflex present. ?Musculoskeletal:  ?   Cervical back: Normal range of motion and neck supple. No tenderness.  ?  Skin: ?   General: Skin is warm and dry.  ?   Capillary Refill: Capillary refill takes less than 2 seconds.  ?Neurological:  ?   Mental Status: He is alert and oriented to person, place, and time.  ? ? ?ED Results / Procedures / Treatments   ?Labs ?(all labs ordered are listed, but only abnormal results are displayed) ?Labs Reviewed  ?GC/CHLAMYDIA PROBE AMP ([place]) NOT AT [facility]  ? ? ?EKG ?None ? ?Radiology ?No results found. ? ?Procedures ?Procedures  ? ? ?Medications Ordered in ED ?Medications  ?cefTRIAXone (ROCEPHIN) injection 500 mg (has no administration in time range)  ?metroNIDAZOLE (FLAGYL) tablet 2,000 mg (has no administration in time range)  ?sterile water  (preservative free) injection (has no administration in time range)  ? ? ?ED Course/ Medical Decision Making/ A&P ?  ?                        ?Medical Decision Making ?Risk ?Prescription drug management. ? ? ?26 year old male presents ED for evaluation of STI.  Please see HPI for further details. ? ?On examination, the patient is afebrile, nontachycardic.  The patient lung sounds are clear bilaterally, the patient is nonhypoxic on room air.  The patient's abdomen is soft and compressible in all 4 quadrants.  The patient is alert and orient x3, follows commands.  Patient penis inspected, no sign of discharge or erythema surrounding, no painful testicular swelling, cremasteric reflexes present. ? ?Patient treated empirically with 500 mg ceftriaxone IM.  Patient also treated with 2000 mg metronidazole for trichomonas.  Patient was sent home on 100 mg doxycycline twice daily for 7 days.  Patient gonorrhea chlamydia test sent off.  Patient refused HIV, syphilis testing. ? ?Patient advised to follow-up for recheck after completion of antibiotics.  Patient voices understanding.  Patient aware to refrain from sexual activity until his antibiotic completion.  Patient given return precautions and he voiced understanding.  The patient had all of his questions answered to his satisfaction.  The patient is stable for discharge. ? ? ?Final Clinical Impression(s) / ED Diagnoses ?Final diagnoses:  ?Penile discharge  ?STD exposure  ? ? ?Rx / DC Orders ?ED Discharge Orders   ? ?      Ordered  ?  doxycycline (VIBRAMYCIN) 100 MG capsule  2 times daily       ? 04/14/22 1027  ? ?  ?  ? ?  ? ? ?  ?Al Decant, PA-C ?04/14/22 1033 ? ?  ?Bethann Berkshire, MD ?04/14/22 1657 ? ?

## 2022-04-14 NOTE — Discharge Instructions (Addendum)
Please return to the ED with any new symptoms or as fevers, pelvic pain, increased penile discharge ?Please pick up and begin taking doxycycline sent in for you.  You will take this twice daily for the next 7 days. ?Please make all attempts in the future to wear condoms. ?Please follow-up on the results of your STI screening on MyChart in the next 1 to 2 days. ? ?

## 2022-04-14 NOTE — ED Triage Notes (Signed)
Patient reports that his sexual partner told him that they had gonorrhea, Trich, and chlamydia. Patient states he is having a yellow penile drainage. ?

## 2022-04-15 LAB — GC/CHLAMYDIA PROBE AMP (~~LOC~~) NOT AT ARMC
Chlamydia: POSITIVE — AB
Comment: NEGATIVE
Comment: NORMAL
Neisseria Gonorrhea: POSITIVE — AB

## 2023-04-29 ENCOUNTER — Encounter (HOSPITAL_COMMUNITY): Payer: Self-pay | Admitting: Emergency Medicine

## 2023-04-29 ENCOUNTER — Emergency Department (HOSPITAL_COMMUNITY)
Admission: EM | Admit: 2023-04-29 | Discharge: 2023-04-30 | Disposition: A | Discharge status: Home / Self Care | Payer: 59 | Attending: Emergency Medicine | Admitting: Emergency Medicine

## 2023-04-29 ENCOUNTER — Other Ambulatory Visit: Payer: Self-pay

## 2023-04-29 DIAGNOSIS — F12188 Cannabis abuse with other cannabis-induced disorder: Secondary | ICD-10-CM | POA: Insufficient documentation

## 2023-04-29 DIAGNOSIS — D72829 Elevated white blood cell count, unspecified: Secondary | ICD-10-CM | POA: Diagnosis not present

## 2023-04-29 DIAGNOSIS — Z1152 Encounter for screening for COVID-19: Secondary | ICD-10-CM | POA: Diagnosis not present

## 2023-04-29 DIAGNOSIS — R112 Nausea with vomiting, unspecified: Secondary | ICD-10-CM

## 2023-04-29 DIAGNOSIS — R1084 Generalized abdominal pain: Secondary | ICD-10-CM | POA: Diagnosis present

## 2023-04-29 LAB — COMPREHENSIVE METABOLIC PANEL
ALT: 19 U/L (ref 0–44)
AST: 30 U/L (ref 15–41)
Albumin: 4.8 g/dL (ref 3.5–5.0)
Alkaline Phosphatase: 71 U/L (ref 38–126)
Anion gap: 15 (ref 5–15)
BUN: 13 mg/dL (ref 6–20)
CO2: 21 mmol/L — ABNORMAL LOW (ref 22–32)
Calcium: 10.2 mg/dL (ref 8.9–10.3)
Chloride: 101 mmol/L (ref 98–111)
Creatinine, Ser: 1.55 mg/dL — ABNORMAL HIGH (ref 0.61–1.24)
GFR, Estimated: >60 mL/min (ref >60)
Glucose, Bld: 138 mg/dL — ABNORMAL HIGH (ref 70–99)
Potassium: 4.3 mmol/L (ref 3.5–5.1)
Sodium: 137 mmol/L (ref 135–145)
Total Bilirubin: 1.1 mg/dL (ref 0.3–1.2)
Total Protein: 7.9 g/dL (ref 6.5–8.1)

## 2023-04-29 LAB — CBC
HCT: 50.6 % (ref 39.0–52.0)
Hemoglobin: 17.5 g/dL — ABNORMAL HIGH (ref 13.0–17.0)
MCH: 31.1 pg (ref 26.0–34.0)
MCHC: 34.6 g/dL (ref 30.0–36.0)
MCV: 89.9 fL (ref 80.0–100.0)
Platelets: 232 10*3/uL (ref 150–400)
RBC: 5.63 MIL/uL (ref 4.22–5.81)
RDW: 12.5 % (ref 11.5–15.5)
WBC: 28.7 10*3/uL — ABNORMAL HIGH (ref 4.0–10.5)
nRBC: 0 % (ref 0.0–0.2)

## 2023-04-29 LAB — LIPASE, BLOOD: Lipase: 29 U/L (ref 11–51)

## 2023-04-29 MED ORDER — ONDANSETRON 4 MG PO TBDP: 4.0000 mg | ORAL_TABLET | Freq: Once | ORAL | Status: DC | PRN

## 2023-04-29 MED ORDER — DROPERIDOL 2.5 MG/ML IJ SOLN
2.5000 mg | Freq: Once | INTRAMUSCULAR | Status: AC
  Administered 2023-04-30: 2.5 mg via INTRAVENOUS
  Filled 2023-04-29: qty 2

## 2023-04-29 NOTE — ED Triage Notes (Addendum)
Pt arrives via EMS d/t NVD, abd pain, and chills x 5 hours. Endorses being around people with similar s/s. Through triage pts story is inconsistent. Sts marijuana use and "street percocet" approx around 3-4 pm.    Pt in distress in triage sts he feels numb and feels like he's going pass out.

## 2023-04-29 NOTE — ED Notes (Signed)
Pt brought back to room for continued care and EDP evaluation as he is uncooperative with staff. Unable to complete temp in triage.

## 2023-04-29 NOTE — ED Provider Notes (Signed)
[facility] EMERGENCY DEPARTMENT AT [facility] Provider Note   CSN: [id number] Arrival date & time: 04/29/23  2250     History  Chief Complaint  Patient presents with   Abdominal Pain    Dennis Edwards is a 27 y.o. male who presents to the ED today for vomiting and diffuse abdominal pain. He reports recent sick contact. Patient admits to smoking marijuana an hour or so prior to vomiting. He reports 8 episodes of yellow vomiting that was not blood-tinged. No fever or chills. No other complaints or concerns.    Home Medications Prior to Admission medications   Medication Sig Start Date End Date Taking? Authorizing Provider  dicyclomine (BENTYL) 20 MG tablet Take 1 tablet (20 mg total) by mouth 2 (two) times daily for 5 days. 04/30/23 05/05/23 Yes Maxwell Marion, PA-C  ondansetron (ZOFRAN) 4 MG tablet Take 1 tablet (4 mg total) by mouth every 8 (eight) hours as needed for up to 5 days for nausea or vomiting. 04/30/23 05/05/23 Yes Maxwell Marion, PA-C  citalopram (CELEXA) 10 MG tablet Take 1 tablet (10 mg total) by mouth daily. 06/19/21   Cannady, Corrie Dandy T, NP  doxycycline (VIBRAMYCIN) 100 MG capsule Take 1 capsule (100 mg total) by mouth 2 (two) times daily. 04/14/22   Al Decant, PA-C  hydrOXYzine (VISTARIL) 25 MG capsule Take 1 capsule (25 mg total) by mouth every 8 (eight) hours as needed. 06/19/21   Marjie Skiff, NP      Allergies    Patient has no known allergies.    Review of Systems   Review of Systems  Gastrointestinal:  Positive for abdominal pain and vomiting.    Physical Exam Updated Vital Signs BP 115/80   Pulse 68   Temp 98.1 F (36.7 C) (Oral)   Resp 18   Ht 6\' 1"  (1.854 m)   Wt 61.2 kg   SpO2 100%   BMI 17.81 kg/m  Physical Exam Vitals and nursing note reviewed.  Constitutional:      Appearance: Normal appearance.  HENT:     Head: Normocephalic and atraumatic.     Mouth/Throat:     Mouth: Mucous membranes are moist.  Eyes:      Conjunctiva/sclera: Conjunctivae normal.     Pupils: Pupils are equal, round, and reactive to light.  Cardiovascular:     Rate and Rhythm: Normal rate.     Pulses: Normal pulses.  Pulmonary:     Effort: Pulmonary effort is normal.  Abdominal:     Palpations: Abdomen is soft.     Tenderness: There is generalized abdominal tenderness.  Skin:    General: Skin is warm and dry.     Coloration: Skin is not jaundiced.     Findings: No rash.  Neurological:     General: No focal deficit present.     Mental Status: He is alert.  Psychiatric:        Mood and Affect: Mood normal.        Behavior: Behavior normal.     ED Results / Procedures / Treatments   Labs (all labs ordered are listed, but only abnormal results are displayed) Labs Reviewed  COMPREHENSIVE METABOLIC PANEL - Abnormal; Notable for the following components:      Result Value   CO2 21 (*)    Glucose, Bld 138 (*)    Creatinine, Ser 1.55 (*)    All other components within normal limits  CBC - Abnormal; Notable for the following components:  WBC 28.7 (*)    Hemoglobin 17.5 (*)    All other components within normal limits  SARS CORONAVIRUS 2 BY RT PCR  LIPASE, BLOOD  URINALYSIS, ROUTINE W REFLEX MICROSCOPIC    EKG EKG Interpretation  Date/Time:  Thursday Apr 29 2023 23:08:35 EDT Ventricular Rate:  67 PR Interval:  167 QRS Duration: 70 QT Interval:  372 QTC Calculation: 393 R Axis:   84 Text Interpretation: Sinus rhythm Atrial premature complex Confirmed by Palumbo, April ([id number]) on 04/29/2023 11:20:41 PM  Radiology No results found.  Procedures Procedures: not indicated.   Medications Ordered in ED Medications  ketorolac (TORADOL) 15 MG/ML injection 15 mg (15 mg Intravenous Not Given 04/30/23 0215)  droperidol (INAPSINE) 2.5 MG/ML injection 2.5 mg (2.5 mg Intravenous Given 04/30/23 0007)  sodium chloride 0.9 % bolus 1,000 mL (0 mLs Intravenous Stopped 04/30/23 0213)  ondansetron (ZOFRAN) injection 4 mg (4  mg Intravenous Given 04/30/23 0211)    ED Course/ Medical Decision Making/ A&P Clinical Course as of 04/30/23 0713  Fri Apr 30, 2023  6045 Patient reassessed. He is snuggling in the bed with his partner. Last emesis 1 hour ago. He has a soft, nondistended abdomen that is nontender; no peritoneal signs. Leukocytosis felt secondary to stress of ongoing vomiting. Has been around cousins of his partner that were sick with a GI illness all week. Plan to PO trial and reassess. If tolerating oral fluids, may be appropriate for ongoing outpatient management. [KH]    Clinical Course User Index [KH] Antony Madura, PA-C                             Medical Decision Making Amount and/or Complexity of Data Reviewed Labs: ordered.  Risk Prescription drug management.   Patient presents to the ED today for persistent vomiting and abdominal pain for the past 2 hours.  He admits to recent sick contact but denies fever or chills.  He was negative for COVID. I ordered and interpreted labs.  Elevated white count was CBC was within normal limits, most likely due to vomiting.  CMP was within normal limits no sign of acute electrolyte abnormality or AKI.  Lipase within normal limits.  Sinus rhythm on EKG with a heart rate of 67.  QT interval is under 500.  Patient given droperidol 2.5 mg, Zofran, and fluids with relief of symptoms.  He denied ketorolac.  Patient reports resolution of symptoms. He was hemodynamically stable for discharge home. Prescriptions given for Bentyl and Zofran.  Strict return instructions given.        Final Clinical Impression(s) / ED Diagnoses Final diagnoses:  Cannabinoid hyperemesis syndrome    Rx / DC Orders ED Discharge Orders          Ordered    ondansetron (ZOFRAN) 4 MG tablet  Every 8 hours PRN        04/30/23 0217    dicyclomine (BENTYL) 20 MG tablet  2 times daily        04/30/23 0217              Maxwell Marion, PA-C 04/30/23 4098    Palumbo,  April, MD 04/30/23 [phone]

## 2023-04-30 LAB — SARS CORONAVIRUS 2 BY RT PCR: SARS Coronavirus 2 by RT PCR: NEGATIVE

## 2023-04-30 MED ORDER — SODIUM CHLORIDE 0.9 % IV BOLUS
1000.0000 mL | Freq: Once | INTRAVENOUS | Status: AC
  Administered 2023-04-30: 1000 mL via INTRAVENOUS

## 2023-04-30 MED ORDER — KETOROLAC TROMETHAMINE 15 MG/ML IJ SOLN: 15.0000 mg | Freq: Once | INTRAMUSCULAR | Status: DC

## 2023-04-30 MED ORDER — ONDANSETRON HCL 4 MG/2ML IJ SOLN
4.0000 mg | Freq: Once | INTRAMUSCULAR | Status: AC
  Administered 2023-04-30: 4 mg via INTRAVENOUS
  Filled 2023-04-30: qty 2

## 2023-04-30 MED ORDER — DICYCLOMINE HCL 20 MG PO TABS: 20.0000 mg | ORAL_TABLET | Freq: Two times a day (BID) | ORAL | 0 refills | Status: DC

## 2023-04-30 MED ORDER — ONDANSETRON HCL 4 MG PO TABS: 4.0000 mg | ORAL_TABLET | Freq: Three times a day (TID) | ORAL | 0 refills | Status: AC | PRN

## 2023-04-30 NOTE — Discharge Instructions (Addendum)
Prescription for dicyclomine and Zofran given to take for for abdominal pain and nausea/vomiting as needed. Continue drinking fluids like water, gatorade, pedialyte, or liquid IV to maintain hydration and electrolytes. Avoid marijuana use as it can cause symptoms to reoccur.  Follow up with PCP if you still have nausea, vomiting, and abdominal pain.  Get help right away if: You have pain in your chest, neck, arm, or jaw. Your heart is beating very quickly. You have trouble breathing or you are breathing very quickly. You feel extremely weak or you faint. You have persistent vomiting. You have vomit that is bright red or looks like black coffee grounds. You have stools (feces) that are bloody or black, or stools that look like tar. You have a severe headache, a stiff neck, or both. You have severe pain, cramping, or bloating in your abdomen. You have signs of dehydration, such as: Dark urine, very little urine, or no urine. Cracked lips. Dry mouth. Sunken eyes. Sleepiness. Weakness.

## 2023-04-30 NOTE — ED Notes (Signed)
Patient tolerated drinking water 

## 2023-10-15 ENCOUNTER — Other Ambulatory Visit: Payer: Self-pay | Admitting: Family Medicine

## 2024-01-11 ENCOUNTER — Ambulatory Visit (HOSPITAL_COMMUNITY)
Admission: EM | Admit: 2024-01-11 | Discharge: 2024-01-11 | Disposition: A | Discharge status: Home / Self Care | Payer: Self-pay

## 2024-01-11 ENCOUNTER — Telehealth (HOSPITAL_COMMUNITY): Payer: Self-pay

## 2024-01-11 ENCOUNTER — Encounter (HOSPITAL_COMMUNITY): Payer: Self-pay

## 2024-01-11 DIAGNOSIS — K0889 Other specified disorders of teeth and supporting structures: Secondary | ICD-10-CM

## 2024-01-11 DIAGNOSIS — K047 Periapical abscess without sinus: Secondary | ICD-10-CM

## 2024-01-11 MED ORDER — AMOXICILLIN-POT CLAVULANATE 875-125 MG PO TABS: 1.0000 | ORAL_TABLET | Freq: Two times a day (BID) | ORAL | 0 refills | Status: AC

## 2024-01-11 NOTE — Telephone Encounter (Signed)
Informed by front desk staff: "PHONE CALL: Patient called in regards to med order not being sent over to [facility] on [address]. Best call back # [phone]."  Received a verbal order from the provider that saw the Patient for Augmentin 875-125 mg twice daily for 7 days. Medication sent in to preferred pharmacy per protocol.   Patient informed and verbalized understanding.

## 2024-01-11 NOTE — ED Triage Notes (Signed)
Chief Complaint: Dental pain in the lower left side of the mouth. Patient thinks it is his wisdom tooth. Patient has noticed swelling and a possible infection. No oral trauma. Patient has had fevers.   Sick exposure: No  Onset: 2 weeks ago  Prescriptions or OTC medications tried: Yes- Cloves natural antioxidant    with mild relief

## 2024-01-11 NOTE — Discharge Instructions (Addendum)
At this time I suspect that your wisdom teeth are breaking the gum surface.  This can sometimes cause pockets where food debris and bacteria can accumulate causing an infection.  At this time I suspect this is what is causing the swelling and pain.  I have sent in an antibiotic called Augmentin for you to take twice per day for 7 days.  This will treat the infection and calm it down while you are being seen by a dentist.  You can use over-the-counter Tylenol and ibuprofen per the manufacturer's instructions for pain management. If you are unable to get in with a dentist in the next 1 to 2 weeks I do recommend using a Waterpik or a water floss or to help clean the area around your molars and wisdom teeth.  This may help prevent further infections until you are seen.  You can also use Biotene mouthwash to help rinse your mouth and keep it moisturized. If you start to notice increased swelling, fevers, chills, jaw pain, trouble swallowing, trouble breathing please go to the emergency room immediately for follow-up

## 2024-01-11 NOTE — ED Provider Notes (Signed)
[facility]    CSN: [id number] Arrival date & time: 01/11/24  1053      History   Chief Complaint Chief Complaint  Patient presents with   Dental Pain    HPI Dennis Edwards is a 28 y.o. male.   HPI   Patient presents today with concerns for left lower dental pain for the past 2 weeks He is concerned for an abscess today. He states the area has been swollen and painful  He denies oral trauma.  He does report that he has had fevers.   He has been using cloves (spice)  to help with pain management  He states he does not have a dentist that he goes to regularly      History reviewed. No pertinent past medical history.  Patient Active Problem List   Diagnosis Date Noted   Situational anxiety 06/19/2021   Exposure to gonorrhea 06/19/2021   Exposure to chlamydia 06/19/2021   ADD (attention deficit disorder) 03/20/2020    Past Surgical History:  Procedure Laterality Date   thumb surgery         Home Medications    Prior to Admission medications   Not on File    Family History Family History  Problem Relation Age of Onset   Healthy Mother    Healthy Father     Social History Social History   Tobacco Use   Smoking status: Former    Types: Cigarettes   Smokeless tobacco: Former  Building services engineer status: Former   Substances: Nicotine, Flavoring  Substance Use Topics   Alcohol use: Yes    Comment: weekends   Drug use: Yes    Types: Marijuana     Allergies   Patient has no known allergies.   Review of Systems Review of Systems  Constitutional:  Negative for chills and fever.  HENT:  Positive for dental problem. Negative for drooling.      Physical Exam Triage Vital Signs ED Triage Vitals  Encounter Vitals Group     BP 01/11/24 1203 113/70     Systolic BP Percentile --      Diastolic BP Percentile --      Pulse Rate 01/11/24 1203 69     Resp 01/11/24 1203 16     Temp 01/11/24 1203 98.3 F (36.8 C)     Temp Source  01/11/24 1203 Oral     SpO2 01/11/24 1203 98 %     Weight 01/11/24 1203 138 lb (62.6 kg)     Height 01/11/24 1203 6\' 1"  (1.854 m)     Head Circumference --      Peak Flow --      Pain Score 01/11/24 1202 8     Pain Loc --      Pain Education --      Exclude from Growth Chart --    No data found.  Updated Vital Signs BP 113/70 (BP Location: Left Arm)   Pulse 69   Temp 98.3 F (36.8 C) (Oral)   Resp 16   Ht 6\' 1"  (1.854 m)   Wt 138 lb (62.6 kg)   SpO2 98%   BMI 18.21 kg/m   Visual Acuity Right Eye Distance:   Left Eye Distance:   Bilateral Distance:    Right Eye Near:   Left Eye Near:    Bilateral Near:     Physical Exam Vitals reviewed.  Constitutional:      General: He is awake.  Appearance: Normal appearance. He is well-developed and well-groomed.  HENT:     Head: Normocephalic and atraumatic.     Mouth/Throat:     Lips: Pink.     Dentition: Dental tenderness, gingival swelling and dental abscesses present.     Pharynx: Oropharynx is clear. Uvula midline. No pharyngeal swelling, oropharyngeal exudate, posterior oropharyngeal erythema, uvula swelling or postnasal drip.     Comments: Left lower wisdom tooth is partially erupted. Appears to have pocket of purulent drainage along outside aspect that is tender to the touch  Right bottom wisdom tooth is also partially erupted  Neurological:     Mental Status: He is alert.  Psychiatric:        Behavior: Behavior is cooperative.      UC Treatments / Results  Labs (all labs ordered are listed, but only abnormal results are displayed) Labs Reviewed - No data to display  EKG   Radiology No results found.  Procedures Procedures (including critical care time)  Medications Ordered in UC Medications - No data to display  Initial Impression / Assessment and Plan / UC Course  I have reviewed the triage vital signs and the nursing notes.  Pertinent labs & imaging results that were available during my care  of the patient were reviewed by me and considered in my medical decision making (see chart for details).      Final Clinical Impressions(s) / UC Diagnoses   Final diagnoses:  Dental abscess  Dentalgia   Acute on chronic, ongoing with current exacerbation  He reports ongoing dental pain for about 2 weeks in the left lower region along the molars Exam reveals lower  partially erupted wisdom teeth bilaterally with gingival swelling along the outer aspect of left wisdom tooth.  There appears to be a small pocket of drainage there as well. Will send in script for Augmentin PO BID x 7 days. Recommend OTC pain relievers, Biotene mouth wash and flushing area with a water flosser or water pick as tolerated until  he is evaluated by dentist. Reviewed that he should seek dental evaluation in the next 1-2 weeks for more definitive management. ED and return precautions reviewed and provided in AVS. Follow up as needed for persistent or progressing symptoms       Discharge Instructions      At this time I suspect that your wisdom teeth are breaking the gum surface.  This can sometimes cause pockets where food debris and bacteria can accumulate causing an infection.  At this time I suspect this is what is causing the swelling and pain.  I have sent in an antibiotic called Augmentin for you to take twice per day for 7 days.  This will treat the infection and calm it down while you are being seen by a dentist.  You can use over-the-counter Tylenol and ibuprofen per the manufacturer's instructions for pain management. If you are unable to get in with a dentist in the next 1 to 2 weeks I do recommend using a Waterpik or a water floss or to help clean the area around your molars and wisdom teeth.  This may help prevent further infections until you are seen.  You can also use Biotene mouthwash to help rinse your mouth and keep it moisturized. If you start to notice increased swelling, fevers, chills, jaw pain,  trouble swallowing, trouble breathing please go to the emergency room immediately for follow-up     ED Prescriptions   None    PDMP not  reviewed this encounter.   Providence Crosby, PA-C 01/11/24 1252

## 2024-01-12 ENCOUNTER — Other Ambulatory Visit: Payer: Self-pay | Admitting: Physician Assistant
